# Patient Record
Sex: Female | Born: 1975 | Race: White | Hispanic: No | Marital: Married | Smoking: Former smoker
Health system: Southern US, Community
[De-identification: ages and names within clinical notes are randomized; demographics above are authoritative.]

## PROBLEM LIST (undated history)

## (undated) DIAGNOSIS — T7840XA Allergy, unspecified, initial encounter: Secondary | ICD-10-CM

## (undated) DIAGNOSIS — N809 Endometriosis, unspecified: Secondary | ICD-10-CM

## (undated) DIAGNOSIS — G4700 Insomnia, unspecified: Secondary | ICD-10-CM

## (undated) DIAGNOSIS — G43909 Migraine, unspecified, not intractable, without status migrainosus: Secondary | ICD-10-CM

## (undated) HISTORY — DX: Migraine, unspecified, not intractable, without status migrainosus: G43.909

## (undated) HISTORY — DX: Endometriosis, unspecified: N80.9

## (undated) HISTORY — DX: Insomnia, unspecified: G47.00

## (undated) HISTORY — DX: Allergy, unspecified, initial encounter: T78.40XA

---

## 2004-08-19 ENCOUNTER — Other Ambulatory Visit: Admission: RE | Admit: 2004-08-19 | Discharge: 2004-08-19 | Payer: Self-pay | Admitting: Obstetrics and Gynecology

## 2005-06-15 ENCOUNTER — Emergency Department (HOSPITAL_COMMUNITY): Admission: EM | Admit: 2005-06-15 | Discharge: 2005-06-15 | Payer: Self-pay | Admitting: Emergency Medicine

## 2005-07-03 ENCOUNTER — Ambulatory Visit: Payer: Self-pay | Admitting: Internal Medicine

## 2005-08-24 ENCOUNTER — Other Ambulatory Visit: Admission: RE | Admit: 2005-08-24 | Discharge: 2005-08-24 | Payer: Self-pay | Admitting: Obstetrics and Gynecology

## 2006-09-06 ENCOUNTER — Other Ambulatory Visit: Admission: RE | Admit: 2006-09-06 | Discharge: 2006-09-06 | Payer: Self-pay | Admitting: Obstetrics and Gynecology

## 2007-01-03 ENCOUNTER — Ambulatory Visit (HOSPITAL_BASED_OUTPATIENT_CLINIC_OR_DEPARTMENT_OTHER): Admission: RE | Admit: 2007-01-03 | Discharge: 2007-01-03 | Payer: Self-pay | Admitting: Obstetrics and Gynecology

## 2007-11-02 ENCOUNTER — Other Ambulatory Visit: Admission: RE | Admit: 2007-11-02 | Discharge: 2007-11-02 | Payer: Self-pay | Admitting: Obstetrics and Gynecology

## 2008-07-02 ENCOUNTER — Inpatient Hospital Stay (HOSPITAL_COMMUNITY): Admission: AD | Admit: 2008-07-02 | Discharge: 2008-07-02 | Payer: Self-pay | Admitting: Obstetrics and Gynecology

## 2008-07-03 ENCOUNTER — Observation Stay (HOSPITAL_COMMUNITY): Admission: AD | Admit: 2008-07-03 | Discharge: 2008-07-03 | Payer: Self-pay | Admitting: Obstetrics and Gynecology

## 2008-07-19 ENCOUNTER — Ambulatory Visit (HOSPITAL_COMMUNITY): Admission: RE | Admit: 2008-07-19 | Discharge: 2008-07-19 | Payer: Self-pay | Admitting: Obstetrics and Gynecology

## 2008-07-24 ENCOUNTER — Encounter: Admission: RE | Admit: 2008-07-24 | Discharge: 2008-07-24 | Payer: Self-pay | Admitting: Obstetrics and Gynecology

## 2008-08-06 ENCOUNTER — Inpatient Hospital Stay (HOSPITAL_COMMUNITY): Admission: AD | Admit: 2008-08-06 | Discharge: 2008-08-06 | Payer: Self-pay | Admitting: Obstetrics and Gynecology

## 2008-08-08 ENCOUNTER — Ambulatory Visit (HOSPITAL_COMMUNITY): Admission: RE | Admit: 2008-08-08 | Discharge: 2008-08-08 | Payer: Self-pay | Admitting: Obstetrics and Gynecology

## 2008-08-10 ENCOUNTER — Ambulatory Visit: Payer: Self-pay | Admitting: Gynecology

## 2008-08-17 ENCOUNTER — Ambulatory Visit: Payer: Self-pay | Admitting: Obstetrics & Gynecology

## 2008-08-22 ENCOUNTER — Ambulatory Visit: Payer: Self-pay | Admitting: Obstetrics & Gynecology

## 2008-08-29 ENCOUNTER — Ambulatory Visit: Payer: Self-pay | Admitting: Obstetrics & Gynecology

## 2008-08-31 ENCOUNTER — Inpatient Hospital Stay (HOSPITAL_COMMUNITY): Admission: RE | Admit: 2008-08-31 | Discharge: 2008-09-04 | Payer: Self-pay | Admitting: Obstetrics and Gynecology

## 2008-08-31 ENCOUNTER — Encounter (INDEPENDENT_AMBULATORY_CARE_PROVIDER_SITE_OTHER): Payer: Self-pay | Admitting: Obstetrics and Gynecology

## 2008-09-06 ENCOUNTER — Ambulatory Visit: Admission: RE | Admit: 2008-09-06 | Discharge: 2008-09-06 | Payer: Self-pay | Admitting: Obstetrics and Gynecology

## 2011-03-31 NOTE — Op Note (Signed)
NAMESAVANHA, ISLAND             ACCOUNT NO.:  [id number]   MEDICAL RECORD NO.:  [id number]          PATIENT TYPE:  INP   LOCATION:  9145                          FACILITY:  [facility]   PHYSICIAN:  Michelle L. Grewal, M.D.DATE OF BIRTH:  September 28, 1976   DATE OF PROCEDURE:  08/31/2008  DATE OF DISCHARGE:                               OPERATIVE REPORT   PREOPERATIVE DIAGNOSES:  Intrauterine pregnancy at 59 and 3/7, twins.  Twin A is footling breech; discordant growth of twin A, enlarged kidneys  of twin B; and cervical dilation.   POSTOPERATIVE DIAGNOSES:  Intrauterine pregnancy at 37 and 3/7, twins.  Twin A is footling breech, discordant growth of twin A; enlarged kidneys  of twin B; and cervical dilation.   PROCEDURE:  Primary low transverse cesarean section.   SURGEON:  Michelle L. Vincente Poli, MD   ANESTHESIA:  Spinal.   FINDINGS:  Baby A; female, footling breech, Apgars 7 at 1 minute and 8  at 5 minutes.  Baby B; also footling breech, 8 at 1 minute and 9 at 5  minutes.   SPECIMENS:  Placenta sent to Pathology.   ESTIMATED BLOOD LOSS:  500 mL.   COMPLICATIONS:  None.   DRAINS:  Foley.   PROCEDURE IN DETAIL:  The patient was taken to the operating room.  Her  spinal was placed and found to be adequate.  She was prepped and draped  in usual sterile fashion.  A Foley catheter was inserted.  A low  transverse incision was made, carried down to the fascia, fascia was  scored in the midline and extended bilaterally.  The rectus muscles were  separated in the midline.  The peritoneum was entered bluntly.  The  bladder blade was inserted, the lower uterine segment was identified,  and the bladder flap was created sharply and then digitally.  The  bladder blade was readjusted.  A low transverse incision was made in the  uterus.  Uterus was entered using a hemostat.  The baby A was in double  footling breech and there was significant cord around the feet.  The  baby was delivered via  breech extraction without difficulty.  There was  also a nuchal cord.  The baby was a female infant, Apgar 7 at 1 minute  and 8 at 5 minutes.  The cord was clamped and cut, and baby was handed  to the awaiting pediatrician.  The sac of B was then ruptured and the  fluid was clear, and the baby's feet came down near the uterine incision  and the baby was delivered via complete breech extraction because it was  presenting as a double footling breech.  The baby was a female infant,  Apgars 8 at 1 minute and 9 at 5 minutes.  The cord was clamped and cut,  and the baby was handed to the awaiting neonatal team.  They were both  taken to Newborn Nursery.  The cord blood was obtained.  The uterus was  explored and the placentas were removed and sent to pathology.  The  uterus was exteriorized and cleared of all clots and  debris.  Pitocin  and antibiotics were given.  The uterine incision was closed in 1 layer  using a 0 chromic in a running locked stitch.  The uterus was returned  to the abdomen.  Irrigation was performed.  The peritoneum and rectus  muscles were reapproximated using 0 Vicryl.  The fascia was closed using  0 Vicryl in a running stitch.  After  irrigation of subcutaneous layer, the skin was closed with a 3-0 Vicryl  subcutaneous and subcuticular, and the skin was closed with Dermabond  skin adhesive.  All sponge, lap, and instrument counts were correct x2.  The patient went to recovery room in stable condition.      Michelle L. Vincente Poli, M.D.  Electronically Signed     MLG/MEDQ  D:  08/31/2008  T:  09/01/2008  Job:  [id number]

## 2011-03-31 NOTE — Discharge Summary (Signed)
Brandi [name], Brandi [name]             ACCOUNT NO.:  [id number]   MEDICAL RECORD NO.:  [id number]         PATIENT TYPE:  WINP   LOCATION:  WOC                           FACILITY:  [facility]   PHYSICIAN:  Guy Sandifer. Henderson Cloud, M.D. DATE OF BIRTH:  20-Mar-1976   DATE OF ADMISSION:  08/29/2008  DATE OF DISCHARGE:  09/04/2008                               DISCHARGE SUMMARY   ADMITTING DIAGNOSES:  1. Intrauterine pregnancy at 30 and 3/7th weeks' estimated gestational      age.  2. Twin pregnancy with discordant growth.  3. Twin A footling breech.   DISCHARGE DIAGNOSIS:  1. Status post low-transverse cesarean section.  2. Viable female infants.   PROCEDURE:  Primary low-transverse cesarean section.   REASON FOR ADMISSION:  Please see written H&P.   HOSPITAL COURSE:  The patient was a 35 year old gravida 1, para 0 that  was admitted to [facility] at 35 and 3/7th weeks'  estimated gestational age for cesarean section.  The patient had been  observed in the office where she had known twin pregnancy, which had  been with concordant growth until approximately October 14,2009, at  which time twin A was noted to be somewhat smaller and there was  discordancy between A and B about 23%.  Twin A was also noted to be in  footling breech position and cervix was dilated to 1-2 cm with B  palpable through the cervix.  The patient was now admitted for delivery.  On admission, vital signs were stable.  Fetal heart tones were reactive.  The patient was taken to the operating room where spinal anesthesia was  administered without difficulty.  A low-transverse incision was made  with delivery of viable twin infants.  The patient tolerated the  procedure well and was taken to the recovery room in stable condition.  On postoperative day #1, the patient was without complaint.  Baby B was  in the NICU on nasal cannula.  Vital signs were stable.  Urine output  was good.  Abdomen soft.  Fundus firm,  nontender.  Incision was clean,  dry, and intact.  On postoperative day #2, baby B continued in the NICU  on room air.  Vital signs were stable.  Abdomen soft.  Incision was  clean, dry, and intact.  Laboratory findings revealed hemoglobin of 8.7  and platelet count of 175,000.  On postoperative day #3, baby B  continued in the NICU.  Vital signs were stable.  She was afebrile.  Abdomen soft.  Fundus firm and nontender.  Incision was clean, dry, and  intact with subcuticular closure noted.  On postoperative day #4, the  patient was without complaint.  Baby B continued in the NICU stable.  Vital signs were stable.  She was afebrile.  Fundus firm and nontender.  Incision was clean, dry, and intact.  The patient was ambulating well.  Discharge instructions were reviewed and the patient was later  discharged home.   CONDITION ON DISCHARGE:  Stable.   DIET:  Regular as tolerated.   ACTIVITY:  No heavy lifting, no driving x2 weeks, no vaginal entry.  FOLLOWUP:  The patient to follow up in the office in 1-2 weeks for  incision check.  She is to call for temperature greater than 100  degrees, persistent nausea, vomiting, heavy vaginal bleeding, and/or  redness or drainage from the incisional site.   DISCHARGE MEDICATIONS:  Percocet 5/325 #30 one p.o. every 4-6 hours  p.r.n.  Motrin 600 mg every 6 hours.  Integra 1 p.o. daily.  Prenatal  vitamins 1 p.o. daily.      Julio Sicks, N.P.      Guy Sandifer. Henderson Cloud, M.D.  Electronically Signed    CC/MEDQ  D:  09/04/2008  T:  09/04/2008  Job:  [id number]

## 2011-04-03 NOTE — Op Note (Signed)
Brandi [name], Brandi [name]             ACCOUNT NO.:  [id number]   MEDICAL RECORD NO.:  [id number]          PATIENT TYPE:  AMB   LOCATION:  NESC                         FACILITY:  [facility]   PHYSICIAN:  Cynthia P. Romine, M.D.DATE OF BIRTH:  [birth year]   DATE OF PROCEDURE:  01/03/2007  DATE OF DISCHARGE:                               OPERATIVE REPORT   PREOPERATIVE DIAGNOSIS:  Infertility, rule out endometriosis.   POSTOPERATIVE DIAGNOSIS:  Infertility, endometriosis.   PROCEDURE:  Diagnostic laparoscopy, YAG  laser vaporization of  endometriotic lesion, tubal dye studies.   SURGEON:  Cynthia P. Romine, M.D.   ANESTHESIA:  General endotracheal.   ESTIMATED BLOOD LOSS:  Minimal.   COMPLICATIONS:  None.   PROCEDURE:  The patient was taken to the operating room and after  induction of adequate general endotracheal anesthesia, was placed in  dorsal lithotomy position and prepped and draped in the usual fashion.  A acorn uterine manipulator was placed.  Attention was next turned to  the abdomen.  A small subumbilical incision was made in the crease of  the umbilicus and the Veress needle was inserted into peritoneal space.  Proper placement was tested by noting a negative aspirate, then free  flow of saline to the Veress needle again with negative aspirate and  then by noting the response of a drop of saline placed at the hub of the  Veress needle to negative pressure as the abdominal wall was elevated.  Pneumoperitoneum was created with 2 liters of CO2 using the Stryker  automatic insufflator.  A disposable 10/11 mm trocar was then inserted  into the peritoneal space and its proper placement noted with a  laparoscope.  Inspection of the pelvis was done and revealed there to be  an area of endometriosis in the right uterosacral ligament and because  it was obvious that there was endometriosis present, it was felt that  these lower two incisions were needed.  Transillumination was done  to  avoid vessels and two 5 mm incisions were made suprapubically on the  right and the left and 5 mm trocars were inserted under direct  visualization.  The uterus was elevated.  The tubes and ovaries were  freely mobile on both sides.  The upper abdomen was examined.  The liver  and gallbladder appeared normal.  There was some scarring on the area of  the appendix and the appendix could not be brought out of the area  behind the cecum.  In the pelvis the right ovary had some punctate areas  of brown endometriosis.  Along the pelvic sidewall directly on top of  the ureter were some clear vesicular areas consistent with  endometriosis.  On the left uterosacral sacral ligament, there was some  endometriosis and on the left ovary there was approximately 1 cm  diameter of round and red endometriosis.  Chromopertubation was then  carried out using indigo carmine and there was free spill of indigo  carmine through the fimbria of both tubes.  The fimbria of both appeared  luxuriant and normal.  The YAG laser was then inserted and set at a  power setting of 15 watts and the areas of endometriosis over the right  and left uterosacral ligaments on both ovaries were ablated.  The clear  vesicular lesions over the ureter were not ablated.  The pelvis was  irrigated with the Nezhat aspirator irrigator. The instruments were  removed from the pelvis and pneumoperitoneum was allowed to escape.  The  sleeves were  removed.  The incisions were closed subcuticularly with 3-0 Vicryl  Rapide and then with Dermabond.  The instruments removed from vagina and  the procedure was terminated.  The patient tolerated it well and went in  satisfactory condition to post anesthesia recovery.      Cynthia P. Romine, M.D.  Electronically Signed     CPR/MEDQ  D:  01/03/2007  T:  01/03/2007  Job:  [id number]

## 2011-08-18 LAB — CBC
HCT: 25.2 — ABNORMAL LOW
HCT: 31.7 — ABNORMAL LOW
Hemoglobin: 8.7 — ABNORMAL LOW
MCHC: 34.5
MCV: 95.5
MCV: 95.9
RBC: 3.32 — ABNORMAL LOW
RDW: 15.4
WBC: 10.1

## 2011-11-17 HISTORY — PX: OTHER SURGICAL HISTORY: SHX169

## 2012-04-08 LAB — OB RESULTS CONSOLE RPR: RPR: NONREACTIVE

## 2012-04-08 LAB — OB RESULTS CONSOLE ABO/RH: RH Type: POSITIVE

## 2012-04-08 LAB — OB RESULTS CONSOLE HEPATITIS B SURFACE ANTIGEN: Hepatitis B Surface Ag: NEGATIVE

## 2012-05-03 ENCOUNTER — Other Ambulatory Visit: Payer: Self-pay | Admitting: Obstetrics and Gynecology

## 2012-11-10 ENCOUNTER — Encounter (HOSPITAL_COMMUNITY): Payer: Self-pay | Admitting: *Deleted

## 2012-11-10 ENCOUNTER — Inpatient Hospital Stay (HOSPITAL_COMMUNITY)
Admission: RE | Admit: 2012-11-10 | Discharge: 2012-11-13 | DRG: 765 | Disposition: A | Discharge status: Home / Self Care | Payer: 59 | Source: Ambulatory Visit | Attending: Obstetrics and Gynecology | Admitting: Obstetrics and Gynecology

## 2012-11-10 ENCOUNTER — Encounter (HOSPITAL_COMMUNITY): Payer: Self-pay | Admitting: Anesthesiology

## 2012-11-10 ENCOUNTER — Encounter (HOSPITAL_COMMUNITY)
Admission: RE | Disposition: A | Discharge status: Home / Self Care | Payer: Self-pay | Source: Ambulatory Visit | Attending: Obstetrics and Gynecology

## 2012-11-10 ENCOUNTER — Inpatient Hospital Stay (HOSPITAL_COMMUNITY): Payer: 59 | Admitting: Anesthesiology

## 2012-11-10 DIAGNOSIS — D62 Acute posthemorrhagic anemia: Secondary | ICD-10-CM | POA: Diagnosis not present

## 2012-11-10 DIAGNOSIS — O34219 Maternal care for unspecified type scar from previous cesarean delivery: Principal | ICD-10-CM | POA: Diagnosis present

## 2012-11-10 DIAGNOSIS — Z302 Encounter for sterilization: Secondary | ICD-10-CM

## 2012-11-10 DIAGNOSIS — O9903 Anemia complicating the puerperium: Secondary | ICD-10-CM | POA: Diagnosis not present

## 2012-11-10 DIAGNOSIS — O09529 Supervision of elderly multigravida, unspecified trimester: Secondary | ICD-10-CM | POA: Diagnosis present

## 2012-11-10 LAB — CBC
Hemoglobin: 12.1 g/dL (ref 12.0–15.0)
MCH: 31.9 pg (ref 26.0–34.0)
MCHC: 34.3 g/dL (ref 30.0–36.0)
Platelets: 177 10*3/uL (ref 150–400)
RBC: 3.79 MIL/uL — ABNORMAL LOW (ref 3.87–5.11)

## 2012-11-10 LAB — RPR: RPR Ser Ql: NONREACTIVE

## 2012-11-10 LAB — TYPE AND SCREEN
ABO/RH(D): O POS
Antibody Screen: NEGATIVE

## 2012-11-10 LAB — ABO/RH: ABO/RH(D): O POS

## 2012-11-10 SURGERY — Surgical Case
Anesthesia: Spinal | Site: Abdomen | Laterality: Bilateral | Wound class: Clean Contaminated

## 2012-11-10 MED ORDER — PROMETHAZINE HCL 25 MG/ML IJ SOLN: 6.2500 mg | INTRAMUSCULAR | Status: DC | PRN

## 2012-11-10 MED ORDER — DIPHENHYDRAMINE HCL 50 MG/ML IJ SOLN
12.5000 mg | INTRAMUSCULAR | Status: DC | PRN
Start: 2012-11-10 — End: 2012-11-13

## 2012-11-10 MED ORDER — MORPHINE SULFATE (PF) 0.5 MG/ML IJ SOLN
INTRAMUSCULAR | Status: DC | PRN
  Administered 2012-11-10: .1 mg via INTRATHECAL

## 2012-11-10 MED ORDER — NALOXONE HCL 0.4 MG/ML IJ SOLN: 0.4000 mg | INTRAMUSCULAR | Status: DC | PRN

## 2012-11-10 MED ORDER — KETOROLAC TROMETHAMINE 30 MG/ML IJ SOLN: 30.0000 mg | Freq: Four times a day (QID) | INTRAMUSCULAR | Status: AC | PRN

## 2012-11-10 MED ORDER — SENNOSIDES-DOCUSATE SODIUM 8.6-50 MG PO TABS
2.0000 | ORAL_TABLET | Freq: Every day | ORAL | Status: DC
  Administered 2012-11-10 – 2012-11-12 (×3): 2 via ORAL

## 2012-11-10 MED ORDER — OXYTOCIN 40 UNITS IN LACTATED RINGERS INFUSION - SIMPLE MED: 62.5000 mL/h | INTRAVENOUS | Status: AC

## 2012-11-10 MED ORDER — SCOPOLAMINE 1 MG/3DAYS TD PT72
MEDICATED_PATCH | TRANSDERMAL | Status: AC
  Administered 2012-11-10: 1.5 mg via TRANSDERMAL
  Filled 2012-11-10: qty 1

## 2012-11-10 MED ORDER — NALBUPHINE SYRINGE 5 MG/0.5 ML
INJECTION | INTRAMUSCULAR | Status: AC
  Administered 2012-11-10: 5 mg via SUBCUTANEOUS
  Filled 2012-11-10: qty 0.5

## 2012-11-10 MED ORDER — KETOROLAC TROMETHAMINE 60 MG/2ML IM SOLN
60.0000 mg | Freq: Once | INTRAMUSCULAR | Status: AC | PRN
  Administered 2012-11-10: 60 mg via INTRAMUSCULAR

## 2012-11-10 MED ORDER — PHENYLEPHRINE HCL 10 MG/ML IJ SOLN
INTRAMUSCULAR | Status: DC | PRN
  Administered 2012-11-10: 80 ug via INTRAVENOUS
  Administered 2012-11-10: 40 ug via INTRAVENOUS
  Administered 2012-11-10 (×3): 80 ug via INTRAVENOUS
  Administered 2012-11-10: 40 ug via INTRAVENOUS

## 2012-11-10 MED ORDER — SCOPOLAMINE 1 MG/3DAYS TD PT72
1.0000 | MEDICATED_PATCH | Freq: Once | TRANSDERMAL | Status: DC
  Administered 2012-11-10: 1.5 mg via TRANSDERMAL

## 2012-11-10 MED ORDER — HYDROMORPHONE HCL PF 1 MG/ML IJ SOLN
INTRAMUSCULAR | Status: AC
  Filled 2012-11-10: qty 1

## 2012-11-10 MED ORDER — OXYTOCIN 10 UNIT/ML IJ SOLN
40.0000 [IU] | INTRAVENOUS | Status: DC | PRN
  Administered 2012-11-10: 40 [IU] via INTRAVENOUS

## 2012-11-10 MED ORDER — NALBUPHINE HCL 10 MG/ML IJ SOLN
5.0000 mg | INTRAMUSCULAR | Status: DC | PRN
  Administered 2012-11-10: 10 mg via INTRAVENOUS
  Filled 2012-11-10: qty 1

## 2012-11-10 MED ORDER — SIMETHICONE 80 MG PO CHEW
80.0000 mg | CHEWABLE_TABLET | Freq: Three times a day (TID) | ORAL | Status: DC
  Administered 2012-11-10 – 2012-11-12 (×9): 80 mg via ORAL

## 2012-11-10 MED ORDER — DIPHENHYDRAMINE HCL 25 MG PO CAPS
25.0000 mg | ORAL_CAPSULE | ORAL | Status: DC | PRN
  Administered 2012-11-11 – 2012-11-12 (×5): 25 mg via ORAL
  Filled 2012-11-10 (×4): qty 1

## 2012-11-10 MED ORDER — FENTANYL CITRATE 0.05 MG/ML IJ SOLN
100.0000 ug | Freq: Once | INTRAMUSCULAR | Status: AC
  Administered 2012-11-10: 100 ug via INTRAVENOUS

## 2012-11-10 MED ORDER — IBUPROFEN 600 MG PO TABS
600.0000 mg | ORAL_TABLET | Freq: Four times a day (QID) | ORAL | Status: DC
  Administered 2012-11-11 – 2012-11-13 (×9): 600 mg via ORAL
  Filled 2012-11-10 (×9): qty 1

## 2012-11-10 MED ORDER — CEFAZOLIN SODIUM-DEXTROSE 2-3 GM-% IV SOLR
INTRAVENOUS | Status: AC
  Filled 2012-11-10: qty 50

## 2012-11-10 MED ORDER — LANOLIN HYDROUS EX OINT: 1.0000 "application " | TOPICAL_OINTMENT | CUTANEOUS | Status: DC | PRN

## 2012-11-10 MED ORDER — FENTANYL CITRATE 0.05 MG/ML IJ SOLN
INTRAMUSCULAR | Status: DC | PRN
  Administered 2012-11-10: 12.5 ug via INTRAVENOUS

## 2012-11-10 MED ORDER — DIPHENHYDRAMINE HCL 25 MG PO CAPS
25.0000 mg | ORAL_CAPSULE | Freq: Four times a day (QID) | ORAL | Status: DC | PRN
  Filled 2012-11-10: qty 1

## 2012-11-10 MED ORDER — BUPIVACAINE IN DEXTROSE 0.75-8.25 % IT SOLN
INTRATHECAL | Status: DC | PRN
  Administered 2012-11-10: 1.4 mL via INTRATHECAL

## 2012-11-10 MED ORDER — HYDROMORPHONE HCL PF 1 MG/ML IJ SOLN
0.2500 mg | INTRAMUSCULAR | Status: DC | PRN
  Administered 2012-11-10 (×3): 0.5 mg via INTRAVENOUS

## 2012-11-10 MED ORDER — ONDANSETRON HCL 4 MG PO TABS: 4.0000 mg | ORAL_TABLET | ORAL | Status: DC | PRN

## 2012-11-10 MED ORDER — MORPHINE SULFATE 0.5 MG/ML IJ SOLN
INTRAMUSCULAR | Status: AC
  Filled 2012-11-10: qty 10

## 2012-11-10 MED ORDER — LACTATED RINGERS IV SOLN: INTRAVENOUS | Status: DC

## 2012-11-10 MED ORDER — LACTATED RINGERS IV SOLN
INTRAVENOUS | Status: DC
  Administered 2012-11-10 – 2012-11-11 (×2): via INTRAVENOUS

## 2012-11-10 MED ORDER — 0.9 % SODIUM CHLORIDE (POUR BTL) OPTIME
TOPICAL | Status: DC | PRN
  Administered 2012-11-10: 1000 mL

## 2012-11-10 MED ORDER — MUPIROCIN 2 % EX OINT: TOPICAL_OINTMENT | Freq: Two times a day (BID) | CUTANEOUS | Status: DC

## 2012-11-10 MED ORDER — SODIUM CHLORIDE 0.9 % IJ SOLN: 3.0000 mL | INTRAMUSCULAR | Status: DC | PRN

## 2012-11-10 MED ORDER — MEPERIDINE HCL 25 MG/ML IJ SOLN: 6.2500 mg | INTRAMUSCULAR | Status: DC | PRN

## 2012-11-10 MED ORDER — TETANUS-DIPHTH-ACELL PERTUSSIS 5-2.5-18.5 LF-MCG/0.5 IM SUSP: 0.5000 mL | Freq: Once | INTRAMUSCULAR | Status: DC

## 2012-11-10 MED ORDER — ONDANSETRON HCL 4 MG/2ML IJ SOLN: 4.0000 mg | INTRAMUSCULAR | Status: DC | PRN

## 2012-11-10 MED ORDER — NALBUPHINE HCL 10 MG/ML IJ SOLN
5.0000 mg | INTRAMUSCULAR | Status: DC | PRN
  Administered 2012-11-10: 5 mg via SUBCUTANEOUS

## 2012-11-10 MED ORDER — KETOROLAC TROMETHAMINE 30 MG/ML IJ SOLN: 15.0000 mg | Freq: Once | INTRAMUSCULAR | Status: DC | PRN

## 2012-11-10 MED ORDER — ONDANSETRON HCL 4 MG/2ML IJ SOLN: 4.0000 mg | Freq: Three times a day (TID) | INTRAMUSCULAR | Status: DC | PRN

## 2012-11-10 MED ORDER — FENTANYL CITRATE 0.05 MG/ML IJ SOLN
INTRAMUSCULAR | Status: AC
  Administered 2012-11-10: 100 ug via INTRAVENOUS
  Filled 2012-11-10: qty 2

## 2012-11-10 MED ORDER — FENTANYL CITRATE 0.05 MG/ML IJ SOLN
INTRAMUSCULAR | Status: AC
  Filled 2012-11-10: qty 2

## 2012-11-10 MED ORDER — CEFAZOLIN SODIUM-DEXTROSE 2-3 GM-% IV SOLR
2.0000 g | INTRAVENOUS | Status: AC
  Administered 2012-11-10: 2 g via INTRAVENOUS

## 2012-11-10 MED ORDER — HYDROMORPHONE HCL PF 1 MG/ML IJ SOLN
INTRAMUSCULAR | Status: AC
  Administered 2012-11-10: 0.5 mg via INTRAVENOUS
  Filled 2012-11-10: qty 1

## 2012-11-10 MED ORDER — PRENATAL MULTIVITAMIN CH
1.0000 | ORAL_TABLET | Freq: Every day | ORAL | Status: DC
  Administered 2012-11-11 – 2012-11-13 (×3): 1 via ORAL
  Filled 2012-11-10 (×2): qty 1

## 2012-11-10 MED ORDER — DIBUCAINE 1 % RE OINT
1.0000 "application " | TOPICAL_OINTMENT | RECTAL | Status: DC | PRN
  Administered 2012-11-10: 1 via RECTAL
  Filled 2012-11-10: qty 28

## 2012-11-10 MED ORDER — SCOPOLAMINE 1 MG/3DAYS TD PT72: 1.0000 | MEDICATED_PATCH | Freq: Once | TRANSDERMAL | Status: DC

## 2012-11-10 MED ORDER — EPHEDRINE 5 MG/ML INJ
INTRAVENOUS | Status: AC
  Filled 2012-11-10: qty 10

## 2012-11-10 MED ORDER — KETOROLAC TROMETHAMINE 60 MG/2ML IM SOLN
INTRAMUSCULAR | Status: AC
  Administered 2012-11-10: 60 mg via INTRAMUSCULAR
  Filled 2012-11-10: qty 2

## 2012-11-10 MED ORDER — METOCLOPRAMIDE HCL 5 MG/ML IJ SOLN: 10.0000 mg | Freq: Three times a day (TID) | INTRAMUSCULAR | Status: DC | PRN

## 2012-11-10 MED ORDER — OXYCODONE-ACETAMINOPHEN 5-325 MG PO TABS
1.0000 | ORAL_TABLET | ORAL | Status: DC | PRN
  Administered 2012-11-11: 1 via ORAL
  Administered 2012-11-11 (×3): 2 via ORAL
  Administered 2012-11-11 – 2012-11-12 (×5): 1 via ORAL
  Administered 2012-11-12: 2 via ORAL
  Administered 2012-11-13: 1 via ORAL
  Administered 2012-11-13: 2 via ORAL
  Administered 2012-11-13: 1 via ORAL
  Filled 2012-11-10: qty 1
  Filled 2012-11-10 (×2): qty 2
  Filled 2012-11-10 (×3): qty 1
  Filled 2012-11-10: qty 2
  Filled 2012-11-10 (×3): qty 1
  Filled 2012-11-10: qty 2
  Filled 2012-11-10: qty 1
  Filled 2012-11-10: qty 2

## 2012-11-10 MED ORDER — MENTHOL 3 MG MT LOZG: 1.0000 | LOZENGE | OROMUCOSAL | Status: DC | PRN

## 2012-11-10 MED ORDER — ZOLPIDEM TARTRATE 5 MG PO TABS: 5.0000 mg | ORAL_TABLET | Freq: Every evening | ORAL | Status: DC | PRN

## 2012-11-10 MED ORDER — ONDANSETRON HCL 4 MG/2ML IJ SOLN
INTRAMUSCULAR | Status: DC | PRN
  Administered 2012-11-10: 4 mg via INTRAVENOUS

## 2012-11-10 MED ORDER — KETOROLAC TROMETHAMINE 30 MG/ML IJ SOLN
30.0000 mg | Freq: Four times a day (QID) | INTRAMUSCULAR | Status: AC | PRN
  Filled 2012-11-10: qty 1

## 2012-11-10 MED ORDER — SIMETHICONE 80 MG PO CHEW: 80.0000 mg | CHEWABLE_TABLET | ORAL | Status: DC | PRN

## 2012-11-10 MED ORDER — NALOXONE HCL 1 MG/ML IJ SOLN: 1.0000 ug/kg/h | INTRAVENOUS | Status: DC | PRN

## 2012-11-10 MED ORDER — DIPHENHYDRAMINE HCL 50 MG/ML IJ SOLN: 25.0000 mg | INTRAMUSCULAR | Status: DC | PRN

## 2012-11-10 MED ORDER — WITCH HAZEL-GLYCERIN EX PADS
1.0000 "application " | MEDICATED_PAD | CUTANEOUS | Status: DC | PRN
  Administered 2012-11-10: 1 via TOPICAL

## 2012-11-10 MED ORDER — LACTATED RINGERS IV SOLN
INTRAVENOUS | Status: DC
  Administered 2012-11-10 (×3): via INTRAVENOUS

## 2012-11-10 MED ORDER — EPHEDRINE SULFATE 50 MG/ML IJ SOLN
INTRAMUSCULAR | Status: DC | PRN
  Administered 2012-11-10 (×4): 10 mg via INTRAVENOUS

## 2012-11-10 MED ORDER — SCOPOLAMINE 1 MG/3DAYS TD PT72
MEDICATED_PATCH | TRANSDERMAL | Status: AC
  Filled 2012-11-10: qty 1

## 2012-11-10 SURGICAL SUPPLY — 32 items
BARRIER ADHS 3X4 INTERCEED (GAUZE/BANDAGES/DRESSINGS) IMPLANT
CLIP FILSHIE TUBAL LIGA STRL (Clip) ×2 IMPLANT
CLOTH BEACON ORANGE TIMEOUT ST (SAFETY) ×2 IMPLANT
CONTAINER PREFILL 10% NBF 15ML (MISCELLANEOUS) IMPLANT
DERMABOND ADVANCED (GAUZE/BANDAGES/DRESSINGS) ×1
DERMABOND ADVANCED .7 DNX12 (GAUZE/BANDAGES/DRESSINGS) ×1 IMPLANT
DRAPE LG THREE QUARTER DISP (DRAPES) ×2 IMPLANT
DRSG OPSITE POSTOP 4X10 (GAUZE/BANDAGES/DRESSINGS) IMPLANT
DURAPREP 26ML APPLICATOR (WOUND CARE) ×2 IMPLANT
ELECT REM PT RETURN 9FT ADLT (ELECTROSURGICAL) ×2
ELECTRODE REM PT RTRN 9FT ADLT (ELECTROSURGICAL) ×1 IMPLANT
EXTRACTOR VACUUM M CUP 4 TUBE (SUCTIONS) IMPLANT
GLOVE BIO SURGEON STRL SZ 6.5 (GLOVE) ×4 IMPLANT
GOWN PREVENTION PLUS LG XLONG (DISPOSABLE) ×6 IMPLANT
KIT ABG SYR 3ML LUER SLIP (SYRINGE) IMPLANT
NEEDLE HYPO 22GX1.5 SAFETY (NEEDLE) IMPLANT
NEEDLE HYPO 25X5/8 SAFETYGLIDE (NEEDLE) ×2 IMPLANT
NS IRRIG 1000ML POUR BTL (IV SOLUTION) ×2 IMPLANT
PACK C SECTION WH (CUSTOM PROCEDURE TRAY) ×2 IMPLANT
PAD OB MATERNITY 4.3X12.25 (PERSONAL CARE ITEMS) IMPLANT
SLEEVE SCD COMPRESS KNEE MED (MISCELLANEOUS) IMPLANT
STAPLER VISISTAT 35W (STAPLE) IMPLANT
SUT CHROMIC 0 CTX 36 (SUTURE) ×4 IMPLANT
SUT PLAIN 0 NONE (SUTURE) IMPLANT
SUT PLAIN 2 0 XLH (SUTURE) IMPLANT
SUT VIC AB 0 CT1 27 (SUTURE) ×3
SUT VIC AB 0 CT1 27XBRD ANBCTR (SUTURE) ×3 IMPLANT
SUT VIC AB 4-0 KS 27 (SUTURE) IMPLANT
SYR CONTROL 10ML LL (SYRINGE) IMPLANT
TOWEL OR 17X24 6PK STRL BLUE (TOWEL DISPOSABLE) ×6 IMPLANT
TRAY FOLEY CATH 14FR (SET/KITS/TRAYS/PACK) ×2 IMPLANT
WATER STERILE IRR 1000ML POUR (IV SOLUTION) ×2 IMPLANT

## 2012-11-10 NOTE — Brief Op Note (Signed)
11/10/2012  3:48 PM  PATIENT:  Brandi [name]  36 y.o. female  PRE-OPERATIVE DIAGNOSIS:   IUP at 61 w 3 days Previous Cesarean Section Labor Desires permanent sterilization  POST-OPERATIVE DIAGNOSIS:  Same  PROCEDURE:  Procedure(s) (LRB) with comments: CESAREAN SECTION WITH BILATERAL TUBAL LIGATION (Bilateral)  SURGEON:  Surgeon(s) and Role:    * Jeani Hawking, MD - Primary  PHYSICIAN ASSISTANT:   ASSISTANTS: none   ANESTHESIA:   spinal  EBL:  Total I/O In: 2000 [I.V.:2000] Out: 800 [Urine:200; Blood:600]  BLOOD ADMINISTERED:none  DRAINS: Urinary Catheter (Foley)   LOCAL MEDICATIONS USED:  NONE  SPECIMEN:  No Specimen  DISPOSITION OF SPECIMEN:  N/A  COUNTS:  YES  TOURNIQUET:  * No tourniquets in log *  DICTATION: .Other Dictation: Dictation Number [id number]  PLAN OF CARE: Admit to inpatient   PATIENT DISPOSITION:  PACU - hemodynamically stable.   Delay start of Pharmacological VTE agent (>24hrs) due to surgical blood loss or risk of bleeding: not applicable

## 2012-11-10 NOTE — Progress Notes (Signed)
Dr Arby Barrette notified of pt's pain level 8 c/o burning at incision site.  Order received to give Fentanyl 2mL IV now and transfer to floor.

## 2012-11-10 NOTE — H&P (Signed)
36 year old G 2 P 0102 (twins) at 78 w 3 days presents from the office. History of C Section for twins. She started having UCs this am. No ROM. Cervix was closed last week. Cervix this am was 2 cm/80%. 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

## 2012-11-10 NOTE — Anesthesia Procedure Notes (Signed)
Spinal  Patient location during procedure: OR Start time: 11/10/2012 3:01 PM End time: 11/10/2012 3:04 PM Staffing Anesthesiologist: Sandrea Hughs Performed by: anesthesiologist  Preanesthetic Checklist Completed: patient identified, site marked, surgical consent, pre-op evaluation, timeout performed, IV checked, risks and benefits discussed and monitors and equipment checked Spinal Block Patient position: sitting Prep: DuraPrep Patient monitoring: heart rate, cardiac monitor, continuous pulse ox and blood pressure Approach: midline Location: L3-4 Injection technique: single-shot Needle Needle type: Sprotte  Needle gauge: 24 G Needle length: 9 cm Needle insertion depth: 5 cm Assessment Sensory level: T4

## 2012-11-10 NOTE — Transfer of Care (Signed)
Immediate Anesthesia Transfer of Care Note  Patient: Brandi [name]  Procedure(s) Performed: Procedure(s) (LRB) with comments: CESAREAN SECTION WITH BILATERAL TUBAL LIGATION (Bilateral)  Patient Location: PACU  Anesthesia Type:Spinal  Level of Consciousness: awake, alert  and oriented  Airway & Oxygen Therapy: Patient Spontanous Breathing  Post-op Assessment: Report given to PACU RN and Post -op Vital signs reviewed and stable  Post vital signs: Reviewed and stable  Complications: No apparent anesthesia complications

## 2012-11-10 NOTE — Anesthesia Postprocedure Evaluation (Signed)
Anesthesia Post Note  Patient: Brandi [name]  Procedure(s) Performed: Procedure(s) (LRB): CESAREAN SECTION WITH BILATERAL TUBAL LIGATION (Bilateral)  Anesthesia type: Spinal  Patient location: PACU  Post pain: Pain level controlled  Post assessment: Post-op Vital signs reviewed  Last Vitals:  Filed Vitals:   11/10/12 1330  BP: 124/64  Pulse: 85  Temp: 36.8 C    Post vital signs: Reviewed  Level of consciousness: awake  Complications: No apparent anesthesia complications

## 2012-11-10 NOTE — Anesthesia Preprocedure Evaluation (Signed)
Anesthesia Evaluation  Patient identified by MRN, date of birth, ID band Patient awake    Reviewed: Allergy & Precautions, H&P , NPO status , Patient's Chart, lab work & pertinent test results  Airway Mallampati: I TM Distance: >3 FB Neck ROM: full    Dental No notable dental hx.    Pulmonary neg pulmonary ROS,    Pulmonary exam normal       Cardiovascular negative cardio ROS      Neuro/Psych negative neurological ROS  negative psych ROS   GI/Hepatic negative GI ROS, Neg liver ROS,   Endo/Other  negative endocrine ROS  Renal/GU negative Renal ROS  negative genitourinary   Musculoskeletal negative musculoskeletal ROS (+)   Abdominal Normal abdominal exam  (+)   Peds  Hematology negative hematology ROS (+)   Anesthesia Other Findings   Reproductive/Obstetrics (+) Pregnancy                           Anesthesia Physical Anesthesia Plan  ASA: II  Anesthesia Plan: Spinal   Post-op Pain Management:    Induction:   Airway Management Planned:   Additional Equipment:   Intra-op Plan:   Post-operative Plan:   Informed Consent: I have reviewed the patients History and Physical, chart, labs and discussed the procedure including the risks, benefits and alternatives for the proposed anesthesia with the patient or authorized representative who has indicated his/her understanding and acceptance.     Plan Discussed with: CRNA and Surgeon  Anesthesia Plan Comments:         Anesthesia Quick Evaluation

## 2012-11-11 ENCOUNTER — Encounter (HOSPITAL_COMMUNITY): Payer: Self-pay | Admitting: Obstetrics and Gynecology

## 2012-11-11 LAB — CBC
HCT: 26.8 % — ABNORMAL LOW (ref 36.0–46.0)
Hemoglobin: 9.3 g/dL — ABNORMAL LOW (ref 12.0–15.0)
MCV: 93.4 fL (ref 78.0–100.0)
RBC: 2.87 MIL/uL — ABNORMAL LOW (ref 3.87–5.11)
WBC: 9.2 10*3/uL (ref 4.0–10.5)

## 2012-11-11 MED ORDER — FERROUS SULFATE 325 (65 FE) MG PO TABS
325.0000 mg | ORAL_TABLET | Freq: Two times a day (BID) | ORAL | Status: DC
  Administered 2012-11-12 – 2012-11-13 (×3): 325 mg via ORAL
  Filled 2012-11-11 (×3): qty 1

## 2012-11-11 MED ORDER — LORATADINE 10 MG PO TABS
10.0000 mg | ORAL_TABLET | Freq: Every day | ORAL | Status: DC
  Administered 2012-11-11 – 2012-11-12 (×3): 10 mg via ORAL
  Filled 2012-11-11 (×3): qty 1

## 2012-11-11 MED ORDER — FLUTICASONE PROPIONATE 50 MCG/ACT NA SUSP
2.0000 | Freq: Every day | NASAL | Status: AC
  Administered 2012-11-11: 2 via NASAL
  Filled 2012-11-11: qty 16

## 2012-11-11 NOTE — Op Note (Signed)
[name], Brandi             ACCOUNT NO.:  [id number]  MEDICAL RECORD NO.:  [id number]  LOCATION:  [facility]                          FACILITY:  [facility]  PHYSICIAN:  Deyjah Kindel L. Vyolet Sakuma, M.D.DATE OF BIRTH:  1976/02/09  DATE OF PROCEDURE:  11/10/2012 DATE OF DISCHARGE:                              OPERATIVE REPORT   PREOPERATIVE DIAGNOSES: 1. Intrauterine pregnancy at 36 and 3. 2. Previous cesarean section. 3. Labor. 4. Desires permanent sterilization.  POSTOPERATIVE DIAGNOSES: 1. Intrauterine pregnancy at 61 and 3. 2. Previous cesarean section. 3. Labor. 4. Desires permanent sterilization.  PROCEDURE:  Repeat low transverse cesarean section and bilateral tubal ligation.  SURGEON:  Airica Schwartzkopf L. Vincente Poli, M.D.  ANESTHESIA:  Spinal.  ESTIMATED BLOOD LOSS:  Less than 500 mL.  COMPLICATIONS:  None.  DRAINS:  Foley.  PATHOLOGY:  None.  PROCEDURE:  The patient was taken to the operating room.  Her spinal was placed by Dr. Arby Barrette.  She was then prepped and draped in usual sterile fashion.  A Foley catheter was inserted.  She was prepped.  Time- out was performed.  A low transverse incision was made at the area of the previous C-section scar and carried down to the fascia.  Fascia scored in the midline, extended laterally.  Rectus muscles were separated in the midline.  The peritoneum was entered bluntly.  The peritoneal incision was then stretched.  The bladder blade was inserted. The lower uterine segment was identified and the bladder flap was created sharply and then digitally.  The bladder blade was then readjusted.  A low transverse incision was made in the uterus.  Uterus was entered using a hemostat.  The baby was in cephalic presentation and delivered easily with vacuum extractor.  The baby was a female infant, Apgars 9 at 1 minute and 10 at 5 minutes.  The cord was clamped and cut. The baby was handed to the awaiting neonatal team.  The placenta was manually removed  noted to be normal intact with a three-vessel cord. The uterus was exteriorized and cleared of all clots and debris.  The uterine incision was closed in 1 layer using 0 chromic in a running, locked stitch.  The Filshie clips were placed across each midportion of each fallopian tube for the tubal ligation.  Ovaries appeared normal. The uterus was returned to the abdomen.  Irrigation performed. Hemostasis was excellent.  The peritoneum was closed using 0 Vicryl. The rectus muscles were reapproximated using 0 Vicryl.  The peritoneum after it was closed, the fascia was then closed using 0 Vicryl in a running stitch.  After irrigation of subcutaneous layer, the skin was closed with 4-0 Vicryl on a Keith needle.  Dermabond was applied, and then a sterile bandage was applied.  All sponge, lap, and instrument counts were correct x2.  The patient went to recovery room in stable condition.     Samule Life L. Vincente Poli, M.D.     Florestine Avers  D:  11/10/2012  T:  11/11/2012  Job:  [id number]

## 2012-11-11 NOTE — Progress Notes (Signed)
Subjective: Postpartum Day 1: Cesarean Delivery Patient reports tolerating PO.  Ambulating well.  Normal lochia.  Pain well controlled with meds.  Would like newborn circumcised.    Objective: Vital signs in last 24 hours: Temp:  [97.4 F (36.3 C)-98.4 F (36.9 C)] 98.3 F (36.8 C) (12/27 0630) Pulse Rate:  [61-85] 69  (12/27 0630) Resp:  [16-26] 18  (12/27 0630) BP: (92-148)/(45-78) 103/61 mmHg (12/27 0630) SpO2:  [95 %-100 %] 96 % (12/27 0630) Weight:  [160 lb (72.576 kg)] 160 lb (72.576 kg) (12/26 1330)  Physical Exam:  General: alert, cooperative and appears stated age 36: appropriate Uterine Fundus: firm Incision: healing well, no significant drainage, no dehiscence, no significant erythema DVT Evaluation: No evidence of DVT seen on physical exam. Negative Homan's sign. No cords or calf tenderness.   Basename 11/11/12 0530 11/10/12 1330  HGB 9.3* 12.1  HCT 26.8* 35.3*    Assessment/Plan: Status post Cesarean section. Doing well postoperatively.  Continue current care. ABL anemia: FeSO4 started.  Marissa Lowrey 11/11/2012, 8:11 AM

## 2012-11-12 NOTE — Progress Notes (Signed)
Subjective: Postpartum Day 2: Cesarean Delivery Patient reports tolerating PO and no problems voiding.    Objective: Vital signs in last 24 hours: Temp:  [97.8 F (36.6 C)-98.3 F (36.8 C)] 98.1 F (36.7 C) (12/28 0616) Pulse Rate:  [55-70] 70  (12/28 0616) Resp:  [16-18] 18  (12/28 0616) BP: (88-118)/(46-73) 101/62 mmHg (12/28 0616) SpO2:  [97 %-98 %] 98 % (12/27 2130)  Physical Exam:  General: alert, cooperative and appears stated age Lochia: appropriate Uterine Fundus: firm Incision: healing well, no significant drainage, no dehiscence, no significant erythema DVT Evaluation: No evidence of DVT seen on physical exam. Negative Homan's sign. No cords or calf tenderness.   Basename 11/11/12 0530 11/10/12 1330  HGB 9.3* 12.1  HCT 26.8* 35.3*    Assessment/Plan: Status post Cesarean section. Doing well postoperatively.  Continue current care.  Brandi [name] 11/12/2012, 8:26 AM

## 2012-11-13 MED ORDER — OXYCODONE-ACETAMINOPHEN 5-325 MG PO TABS: 1.0000 | ORAL_TABLET | ORAL | Status: DC | PRN

## 2012-11-13 MED ORDER — IBUPROFEN 600 MG PO TABS: 600.0000 mg | ORAL_TABLET | Freq: Four times a day (QID) | ORAL | Status: AC

## 2012-11-13 NOTE — Progress Notes (Signed)
Subjective: Postpartum Day 3: Cesarean Delivery Patient reports tolerating PO and no problems voiding.    Objective: Vital signs in last 24 hours: Temp:  [98.2 F (36.8 C)-99.3 F (37.4 C)] 98.2 F (36.8 C) (12/29 9147) Pulse Rate:  [73-81] 81  (12/29 0623) Resp:  [18] 18  (12/29 0623) BP: (103-129)/(60-76) 103/60 mmHg (12/29 8295)  Physical Exam:  General: alert, cooperative and appears stated age Lochia: appropriate Uterine Fundus: firm Incision: healing well, no significant drainage, no dehiscence, no significant erythema DVT Evaluation: No evidence of DVT seen on physical exam. Negative Homan's sign. No cords or calf tenderness.   Basename 11/11/12 0530 11/10/12 1330  HGB 9.3* 12.1  HCT 26.8* 35.3*    Assessment/Plan: Status post Cesarean section. Doing well postoperatively.  Discharge home with standard precautions and return to clinic in 4-6 weeks.  Ellene Bloodsaw 11/13/2012, 8:12 AM

## 2012-11-13 NOTE — Discharge Summary (Signed)
Obstetric Discharge Summary Reason for Admission: onset of labor Prenatal Procedures: none Intrapartum Procedures: cesarean: low cervical, transverse Postpartum Procedures: none Complications-Operative and Postpartum: none Hemoglobin  Date Value Range Status  11/11/2012 9.3* 12.0 - 15.0 g/dL Final     DELTA CHECK NOTED     REPEATED TO VERIFY     HCT  Date Value Range Status  11/11/2012 26.8* 36.0 - 46.0 % Final    Physical Exam:  General: alert, cooperative and appears stated age 36: appropriate Uterine Fundus: firm Incision: healing well, no significant drainage, no dehiscence, no significant erythema DVT Evaluation: No evidence of DVT seen on physical exam.  Discharge Diagnoses: Term Pregnancy-delivered  Discharge Information: Date: 11/13/2012 Activity: pelvic rest Diet: routine Medications: PNV, Ibuprofen and Percocet Condition: stable Instructions: refer to practice specific booklet Discharge to: home   Newborn Data: Live born female  Birth Weight: 8 lb 1.1 oz (3660 g) APGAR: 9, 10  Home with mother.  Brandi [name] 11/13/2012, 8:16 AM

## 2012-11-14 NOTE — Progress Notes (Signed)
Post discharge chart review completed.  

## 2012-11-21 ENCOUNTER — Inpatient Hospital Stay: Admit: 2012-11-21 | Payer: Self-pay | Admitting: Obstetrics and Gynecology

## 2012-11-21 SURGERY — Surgical Case
Anesthesia: Regional | Laterality: Bilateral

## 2012-11-23 ENCOUNTER — Ambulatory Visit (HOSPITAL_COMMUNITY)
Admit: 2012-11-23 | Discharge: 2012-11-23 | Disposition: A | Discharge status: Home / Self Care | Payer: 59 | Attending: Obstetrics and Gynecology | Admitting: Obstetrics and Gynecology

## 2012-11-23 NOTE — Progress Notes (Signed)
Adult Lactation Consultation Outpatient Visit Note  Patient Name: Brandi [name]                              Brandi [name], DOB [birth year] Birth Weight 8 lb. 1 oz,  Date of Birth: [birth year]                                               Now 68 days old Gestational Age at Delivery: Unknown Type of Delivery: C/S  Breastfeeding History: Frequency of Breastfeeding: every 2- 2 1/2 hours during the day, bottles at night 30 ml for 2 feedings. Length of Feeding: 15-20 minutes Voids: 4-5/day Stools: 2-3/day, yellow/brown in color  Supplementing / Method: Pumping:  Type of Pump:  Medela Pump N Style   Frequency:  4-5 times day  Volume: Pumping 1 breast 15-20 ml, if pumps both breasts to give bottle for feeding, Mom reports getting 30-40 ml of EBM   Comments: Mom is here today for feeding assessment. She has been using #24 nipple shield to latch Brandi. She is c/o of pain with breastfeeding, no breakdown reported. She is also concerned about milk supply. She did not produce enough milk for her twins and had difficulty with latching her twins as well. She reports her breasts fill between feedings but she never feels engorged. Last weight check at [facility] the Tuesday after his birth, 11/15/12, his weight was 7 lb. 2.5 oz.    Consultation Evaluation: Brandi Mac was awake and alert at this visit. When Mom went to latch Brandi Mac it was noted the #24 nipple shield is too large. Mom declined to attempt latching the Brandi without the nipple shield, she reports it is too painful and he will not latch. Changed nipple shield to size #20, Brandi Mac latched easily and Mom reports this feels better on her breast. Brandi Mac demonstrated a good suckling rhythm, some adjustment needed to keep his bottom lip down. Breast milk visible in the nipple shield at the end of the feeding.   Initial Feeding Assessment: Pre-feed Weight:  7 lb. 0.4 oz/3188 gm Post-feed Weight:  7 lb. 1.2 oz/ 3208 gm Amount Transferred:  20  ml Comments:  From right breast using #20 nipple shield  Additional Feeding Assessment: Pre-feed Weight:  7 lb. 1.2 oz/3208 gm Post-feed Weight:  7 lb. 1.7 oz/3224 gm Amount Transferred:  16 ml. Comments:  From left breast   Additional Feeding Assessment: Pre-feed Weight: Post-feed Weight: Amount Transferred: Comments:  Total Breast milk Transferred this Visit: 36 ml. Total Supplement Given: 30 ml of Similac  Additional Interventions: Discussed Brandi Mac's weight loss with Mom and according to milk transfer at this visit and weight loss, Mom needs to supplement after each feeding till her milk supply increases and Brandi Mac is transferring breast milk more efficiently. Plan given to Mom: Breast feed every 2-3 hours, keep Brandi active at the breast for 15-20 minutes. Use the #20 nipple shield to latch Brandi Mac. Post pump both breast for 15 minutes. Supplement after each feeding 30-40 ml of EBM or formula. If the Brandi does not breast feed at a feeding, increase supplement to 60-90 ml. Monitor voids/stools. Consider supplements to support and increase breast milk. Discussed More Milk Plus from [employer]. Discussed importance of breastfeeding/pumping, emptying breast to  milk supply and production.   Follow-Up Lactation OP appointment 12/02/12 at 10:30 Come to Support Group on 11/29/12 at 11:00 for weight check.     Alfred Levins 11/23/2012, 9:41 AM

## 2012-11-23 NOTE — Progress Notes (Deleted)
Adult Lactation Consultation Outpatient Visit Note  Patient Name: Brandi [name] Date of Birth: 1976/10/14 Gestational Age at Delivery: Unknown Type of Delivery:   Breastfeeding History: Frequency of Breastfeeding:  Length of Feeding:  Voids:  Stools:   Supplementing / Method: Pumping:  Type of Pump:   Frequency:  Volume:    Comments:    Consultation Evaluation:  Initial Feeding Assessment: Pre-feed Weight: Post-feed Weight: Amount Transferred: Comments:  Additional Feeding Assessment: Pre-feed Weight: Post-feed Weight: Amount Transferred: Comments:  Additional Feeding Assessment: Pre-feed Weight: Post-feed Weight: Amount Transferred: Comments:  Total Breast milk Transferred this Visit:  Total Supplement Given:   Additional Interventions:   Follow-Up      Alfred Levins 11/23/2012, 8:30 AM

## 2012-12-01 ENCOUNTER — Ambulatory Visit (HOSPITAL_COMMUNITY): Payer: 59

## 2012-12-02 ENCOUNTER — Ambulatory Visit (HOSPITAL_COMMUNITY)
Admission: RE | Admit: 2012-12-02 | Discharge: 2012-12-02 | Disposition: A | Discharge status: Home / Self Care | Payer: 59 | Source: Ambulatory Visit | Attending: Obstetrics and Gynecology | Admitting: Obstetrics and Gynecology

## 2012-12-02 NOTE — Progress Notes (Signed)
Adult Lactation Consultation Outpatient Visit Note : Follow up low milk supply, weight loss  Patient Name: Raymon Mutton Usery(mother)  BABY: Townsend Roger Date of Birth: 07/07/1976  Gestational Age at Delivery: 38.4                                                                       DOB: [birth year] Type of Delivery: C/S                                    BIRTH WEIGHT: 8-1                                                                        WEIGHT TODAY: 8-0.7 Breastfeeding History: Frequency of Breastfeeding: every 3 hours during the day Length of Feeding: 12 minutes each breast Voids: qs Stools: qs  Supplementing / Method:formula 2 oz pc every 3 hours Pumping:  Type of Pump:PIS   Frequency:once per day in place of feeding  Volume: 30-40 mls  Comments:    Consultation Evaluation:Baby has gained 16 oz in 1 week. Mom is very happy how feedings are going and OK with needing to supplement.  Mom has 63 year old twins at home and she admits it would be difficult to do a lot of pumping or complicated plans.  Baby is very alert and nursed actively on both breasts with 20 mm nipple shield.  Baby obtaining deep latch and mom comfortable with feedings.  Discussed with mom that as baby grows she may want to try her 24 mm shield to allow baby to obtain deepest latch possible.  Discussed that now baby is energized from great weight gain and nursing so well her supply will most likely increase.  Mom will call if she desires another OP appointment.  Initial Feeding Assessment:15 minutes left, 20 minutes left Pre-feed [id number] Post-feed [id number] Amount Transferred:40 mls Comments:  Additional Feeding Assessment: Pre-feed Weight: Post-feed Weight: Amount Transferred: Comments:  Additional Feeding Assessment: Pre-feed Weight: Post-feed Weight: Amount Transferred: Comments:  Total Breast milk Transferred this Visit: 40 mls Total Supplement Given: 60 mls formula  Additional  Interventions:   Follow-Up will call prn      Hansel Feinstein 12/02/2012, 9:38 AM

## 2012-12-20 ENCOUNTER — Other Ambulatory Visit: Payer: Self-pay | Admitting: Obstetrics and Gynecology

## 2014-03-15 ENCOUNTER — Other Ambulatory Visit: Payer: Self-pay | Admitting: Obstetrics and Gynecology

## 2014-08-28 ENCOUNTER — Encounter: Payer: Self-pay | Admitting: Internal Medicine

## 2014-09-17 ENCOUNTER — Encounter (HOSPITAL_COMMUNITY): Payer: Self-pay | Admitting: Obstetrics and Gynecology

## 2014-10-29 ENCOUNTER — Encounter: Payer: Self-pay | Admitting: *Deleted

## 2014-10-29 ENCOUNTER — Other Ambulatory Visit: Payer: Self-pay | Admitting: Internal Medicine

## 2014-11-01 ENCOUNTER — Encounter: Payer: 59 | Admitting: Internal Medicine

## 2014-11-01 ENCOUNTER — Encounter: Payer: Self-pay | Admitting: *Deleted

## 2014-11-01 NOTE — Progress Notes (Signed)
The patient's chart has been reviewed by Dr. Hilarie Fredrickson  and the recommendations are noted below.  Follow-up advised. Schedule patient for next available appointment.  Outcome of communication with the patient:   Phone number provided in our system is disconnected. I will send a letter.

## 2014-11-01 NOTE — Progress Notes (Signed)
This encounter was created in error - please disregard.

## 2015-01-11 ENCOUNTER — Encounter: Payer: Self-pay | Admitting: Internal Medicine

## 2015-01-11 ENCOUNTER — Ambulatory Visit (INDEPENDENT_AMBULATORY_CARE_PROVIDER_SITE_OTHER): Payer: 59 | Admitting: Internal Medicine

## 2015-01-11 VITALS — BP 96/56 | HR 60 | Ht 62.75 in | Wt 130.0 lb

## 2015-01-11 DIAGNOSIS — Z800 Family history of malignant neoplasm of digestive organs: Secondary | ICD-10-CM | POA: Insufficient documentation

## 2015-01-11 NOTE — Progress Notes (Signed)
Patient ID: Brandi [name], female   DOB: 04/09/1976, 39 y.o.   MRN: [id number] HPI: Brandi [name] is a 39 yo female with PMH of endometriosis and migraines with a family history significant for colorectal cancer in her mother at age 47 who is seen in consultation at the request of Joni Reining, PA-C to discuss elevated risk screening colonoscopy. She is here alone today. She reports her mother was diagnosed with stage II colon cancer at age 13. No other family history of colon polyps or colon cancer to her knowledge. Her dad has a lung nodule which is currently being evaluated. She does have a strong family history of coronary artery disease on both sides of her family. She reports she is feeling well and she denies specific GI complaint. She reports regular bowel habits with no change in bowel function. She denies blood in her stool or melena. No abdominal pain. No diarrhea or constipation. No nausea or vomiting. Stable weight. Good appetite. No trouble with heartburn, dysphagia or odynophagia. She denies hepatobiliary complaint.    Past Medical History  Diagnosis Date  . Insomnia   . Endometriosis   . Migraines     Past Surgical History  Procedure Laterality Date  . Cesarean section with bilateral tubal ligation  11/10/2012    Procedure: CESAREAN SECTION WITH BILATERAL TUBAL LIGATION;  Surgeon: Cyril Mourning, MD;  Location: [facility];  Service: Obstetrics;  Laterality: Bilateral;  . Cesarean section  2009  . Tubees tied  2013    Outpatient Prescriptions Prior to Visit  Medication Sig Dispense Refill  . ibuprofen (ADVIL,MOTRIN) 600 MG tablet Take 1 tablet (600 mg total) by mouth every 6 (six) hours. 30 tablet 0  . loratadine (CLARITIN) 10 MG tablet Take 10 mg by mouth daily.    . diphenhydramine-acetaminophen (TYLENOL PM) 25-500 MG TABS Take 1 tablet by mouth at bedtime as needed. For sleep    . oxyCODONE-acetaminophen (PERCOCET/ROXICET) 5-325 MG per tablet Take 1-2 tablets by  mouth every 4 (four) hours as needed (moderate - severe pain). 40 tablet 0  . phenylephrine-shark liver oil-mineral oil-petrolatum (PREPARATION H) 0.25-3-14-71.9 % rectal ointment Place 1 application rectally 2 (two) times daily as needed. For hemorrhoids    . Prenatal Vit-Fe Fumarate-FA (MULTIVITAMIN-PRENATAL) 27-0.8 MG TABS Take 1 tablet by mouth daily.     No facility-administered medications prior to visit.    No Known Allergies  Family History  Problem Relation Age of Onset  . Colon cancer Mother 50  . Heart disease Father   . Multiple sclerosis Mother   . Stroke Mother   . Prostate cancer Father   . Colon polyps Mother   . Kidney disease Neg Hx   . Diabetes Neg Hx   . Gallbladder disease Neg Hx   . Esophageal cancer Neg Hx     History  Substance Use Topics  . Smoking status: Former Smoker -- 0.25 packs/day for 4 years    Types: Cigarettes    Quit date: 11/16/1998  . Smokeless tobacco: Never Used  . Alcohol Use: 0.0 oz/week    0 Standard drinks or equivalent per week     Comment: occassionally    ROS: As per history of present illness, otherwise negative  BP 96/56 mmHg  Pulse 60  Ht 5' 2.75" (1.594 m)  Wt 130 lb (58.968 kg)  BMI 23.21 kg/m2  LMP 01/11/2015 Constitutional: Well-developed and well-nourished. No distress. HEENT: Normocephalic and atraumatic. Oropharynx is clear and moist. No oropharyngeal exudate.  Conjunctivae are normal.  No scleral icterus. Neck: Neck supple. Trachea midline. Cardiovascular: Normal rate, regular rhythm and intact distal pulses. No M/R/G Pulmonary/chest: Effort normal and breath sounds normal. No wheezing, rales or rhonchi. Abdominal: Soft, nontender, nondistended. Bowel sounds active throughout. There are no masses palpable. No hepatosplenomegaly. Extremities: no clubbing, cyanosis, or edema Lymphadenopathy: No cervical adenopathy noted. Neurological: Alert and oriented to person place and time. Skin: Skin is warm and dry. No  rashes noted. Psychiatric: Normal mood and affect. Behavior is normal.  RELEVANT LABS AND IMAGING: Labs from oct 2015 tsh 3.3 CMP within normal limits, hemoglobin 15.5, hematocrit 44.2, MCV 92, platelet count 256, WBC 6.1   ASSESSMENT/PLAN: 39 yo female with PMH of endometriosis and migraines with a family history significant for colorectal cancer in her mother at age 85 who is seen in consultation at the request of Joni Reining, PA-C to discuss elevated risk screening colonoscopy.  1. Family history of colon cancer requiring screening colonoscopy -- with a long discussion today regarding current guidelines as it relates to colorectal cancer screening in patients with first degree family members with colorectal cancer. Her mother's cancer was diagnosed at an early age, specifically 72 years. Guidelines support beginning screening colonoscopy at age 31 in her case. We discussed this and she agrees with this recommendation. We discussed the risks, benefits and alternatives to colonoscopy and after this discussion she understands and is agreeable to proceed. We will place a reminder for screening colonoscopy for next February 2017 when she will be 39 years old. I asked that she notify me should she develop any change in her bowel habit, blood in her stool or abdominal pain prior to this and she voiced understanding   Cc:Aura Dials, [address], [place] [address]

## 2015-01-11 NOTE — Patient Instructions (Signed)
You will be due for a recall colonoscopy in 12/2015. We will send you a reminder in the mail when it gets closer to that time.

## 2015-12-27 ENCOUNTER — Encounter: Payer: Self-pay | Admitting: Internal Medicine

## 2016-10-30 ENCOUNTER — Encounter: Payer: Self-pay | Admitting: Internal Medicine

## 2017-01-07 ENCOUNTER — Encounter: Payer: 59 | Admitting: Internal Medicine

## 2017-02-22 ENCOUNTER — Ambulatory Visit (AMBULATORY_SURGERY_CENTER): Payer: Self-pay

## 2017-02-22 VITALS — Ht 63.0 in | Wt 145.6 lb

## 2017-02-22 DIAGNOSIS — Z800 Family history of malignant neoplasm of digestive organs: Secondary | ICD-10-CM

## 2017-02-22 MED ORDER — SUPREP BOWEL PREP KIT 17.5-3.13-1.6 GM/177ML PO SOLN
1.0000 | Freq: Once | ORAL | 0 refills | Status: AC
Start: 2017-02-22 — End: 2017-02-22

## 2017-02-22 NOTE — Progress Notes (Signed)
No allergies to eggs or soy No diet meds No home oxygen No past problems with anesthesia  Registered for emmi 

## 2017-02-25 ENCOUNTER — Encounter: Payer: Self-pay | Admitting: Internal Medicine

## 2017-03-08 ENCOUNTER — Encounter: Payer: Self-pay | Admitting: Internal Medicine

## 2017-03-08 ENCOUNTER — Ambulatory Visit (AMBULATORY_SURGERY_CENTER): Payer: 59 | Admitting: Internal Medicine

## 2017-03-08 VITALS — BP 89/48 | HR 62 | Temp 98.0°F | Resp 25 | Ht 63.0 in | Wt 145.0 lb

## 2017-03-08 DIAGNOSIS — K635 Polyp of colon: Secondary | ICD-10-CM

## 2017-03-08 DIAGNOSIS — Z1211 Encounter for screening for malignant neoplasm of colon: Secondary | ICD-10-CM | POA: Diagnosis not present

## 2017-03-08 DIAGNOSIS — Z800 Family history of malignant neoplasm of digestive organs: Secondary | ICD-10-CM | POA: Diagnosis present

## 2017-03-08 DIAGNOSIS — Z1212 Encounter for screening for malignant neoplasm of rectum: Secondary | ICD-10-CM | POA: Diagnosis not present

## 2017-03-08 DIAGNOSIS — D123 Benign neoplasm of transverse colon: Secondary | ICD-10-CM

## 2017-03-08 MED ORDER — SODIUM CHLORIDE 0.9 % IV SOLN: 500.0000 mL | INTRAVENOUS | Status: AC

## 2017-03-08 MED ORDER — PRAMOXINE-HC 1-1 % EX CREA: TOPICAL_CREAM | Freq: Three times a day (TID) | CUTANEOUS | 1 refills | Status: AC

## 2017-03-08 NOTE — Progress Notes (Signed)
Called to room to assist during endoscopic procedure.  Patient ID and intended procedure confirmed with present staff. Received instructions for my participation in the procedure from the performing physician.  

## 2017-03-08 NOTE — Patient Instructions (Signed)
YOU HAD AN ENDOSCOPIC PROCEDURE TODAY AT THE [facility]:   Refer to the procedure report that was given to you for any specific questions about what was found during the examination.  If the procedure report does not answer your questions, please call your gastroenterologist to clarify.  If you requested that your care partner not be given the details of your procedure findings, then the procedure report has been included in a sealed envelope for you to review at your convenience later.  YOU SHOULD EXPECT: Some feelings of bloating in the abdomen. Passage of more gas than usual.  Walking can help get rid of the air that was put into your GI tract during the procedure and reduce the bloating. If you had a lower endoscopy (such as a colonoscopy or flexible sigmoidoscopy) you may notice spotting of blood in your stool or on the toilet paper. If you underwent a bowel prep for your procedure, you may not have a normal bowel movement for a few days.  Please Note:  You might notice some irritation and congestion in your nose or some drainage.  This is from the oxygen used during your procedure.  There is no need for concern and it should clear up in a day or so.  SYMPTOMS TO REPORT IMMEDIATELY:   Following lower endoscopy (colonoscopy or flexible sigmoidoscopy):  Excessive amounts of blood in the stool  Significant tenderness or worsening of abdominal pains  Swelling of the abdomen that is new, acute  Fever of 100F or higher   For urgent or emergent issues, a gastroenterologist can be reached at any hour by calling [phone].   DIET:  We do recommend a small meal at first, but then you may proceed to your regular diet.  Drink plenty of fluids but you should avoid alcoholic beverages for 24 hours.  ACTIVITY:  You should plan to take it easy for the rest of today and you should NOT DRIVE or use heavy machinery until tomorrow (because of the sedation medicines used during the test).     FOLLOW UP: Our staff will call the number listed on your records the next business day following your procedure to check on you and address any questions or concerns that you may have regarding the information given to you following your procedure. If we do not reach you, we will leave a message.  However, if you are feeling well and you are not experiencing any problems, there is no need to return our call.  We will assume that you have returned to your regular daily activities without incident.  If any biopsies were taken you will be contacted by phone or by letter within the next 1-3 weeks.  Please call us at [phone] if you have not heard about the biopsies in 3 weeks.    SIGNATURES/CONFIDENTIALITY: You and/or your care partner have signed paperwork which will be entered into your electronic medical record.  These signatures attest to the fact that that the information above on your After Visit Summary has been reviewed and is understood.  Full responsibility of the confidentiality of this discharge information lies with you and/or your care-partner.  Read all of the handouts given to you by your recovery room nurse. 

## 2017-03-08 NOTE — Progress Notes (Signed)
Report given to PACU, vss 

## 2017-03-08 NOTE — Op Note (Signed)
[facility] Patient Name: Brandi [name] Procedure Date: 03/08/2017 10:27 AM MRN: [id number] Endoscopist: Jerene Bears , MD Age: 41 Referring MD:  Date of Birth: 1976/02/15 Gender: Female Account #: [id number] Procedure:                Colonoscopy Indications:              Screening in patient at increased risk: Family                            history of 1st-degree relative with colorectal                            cancer before age 26 years, This is the patient's                            first colonoscopy Medicines:                Monitored Anesthesia Care Procedure:                Pre-Anesthesia Assessment:                           - Prior to the procedure, a History and Physical                            was performed, and patient medications and                            allergies were reviewed. The patient's tolerance of                            previous anesthesia was also reviewed. The risks                            and benefits of the procedure and the sedation                            options and risks were discussed with the patient.                            All questions were answered, and informed consent                            was obtained. Prior Anticoagulants: The patient has                            taken no previous anticoagulant or antiplatelet                            agents. ASA Grade Assessment: II - A patient with                            mild systemic disease. After reviewing the risks  and benefits, the patient was deemed in                            satisfactory condition to undergo the procedure.                           After obtaining informed consent, the colonoscope                            was passed under direct vision. Throughout the                            procedure, the patient's blood pressure, pulse, and                            oxygen saturations were monitored continuously.  The                            Colonoscope was introduced through the anus and                            advanced to the the terminal ileum. The colonoscopy                            was performed without difficulty. The patient                            tolerated the procedure well. The quality of the                            bowel preparation was excellent. The terminal                            ileum, ileocecal valve, appendiceal orifice, and                            rectum were photographed. Scope In: 10:44:42 AM Scope Out: 10:59:50 AM Scope Withdrawal Time: 0 hours 12 minutes 1 second  Total Procedure Duration: 0 hours 15 minutes 8 seconds  Findings:                 The digital rectal exam was normal.                           The terminal ileum appeared normal.                           Two flat polyps were found in the hepatic flexure.                            The polyps were 2 to 6 mm in size. These polyps                            were removed with a cold snare. Resection and  retrieval were complete.                           The exam was otherwise without abnormality on                            direct and retroflexion views. Complications:            No immediate complications. Estimated Blood Loss:     Estimated blood loss was minimal. Impression:               - The examined portion of the ileum was normal.                           - Two 2 to 6 mm polyps at the hepatic flexure,                            removed with a cold snare. Resected and retrieved.                           - The examination was otherwise normal on direct                            and retroflexion views. Recommendation:           - Patient has a contact number available for                            emergencies. The signs and symptoms of potential                            delayed complications were discussed with the                            patient.  Return to normal activities tomorrow.                            Written discharge instructions were provided to the                            patient.                           - Resume previous diet.                           - Continue present medications.                           - Await pathology results.                           - Repeat colonoscopy is recommended. The                            colonoscopy date will be determined after pathology  results from today's exam become available for                            review. Jerene Bears, MD 03/08/2017 11:04:33 AM This report has been signed electronically.

## 2017-03-09 ENCOUNTER — Telehealth: Payer: Self-pay

## 2017-03-09 ENCOUNTER — Telehealth: Payer: Self-pay | Admitting: *Deleted

## 2017-03-09 NOTE — Telephone Encounter (Signed)
  Follow up Call-  Call back number 03/08/2017  Post procedure Call Back phone  # [phone]  Permission to leave phone message Yes  Some recent data might be hidden     Tried leaving a message but the mailbox was full, will try again later.

## 2017-03-09 NOTE — Telephone Encounter (Signed)
  Follow up Call-  Call back number 03/08/2017  Post procedure Call Back phone  # [phone]  Permission to leave phone message Yes  Some recent data might be hidden     Patient questions:  Do you have a fever, pain , or abdominal swelling? No. Pain Score  0 *  Have you tolerated food without any problems? Yes.    Have you been able to return to your normal activities? Yes.    Do you have any questions about your discharge instructions: Diet   No. Medications  No. Follow up visit  No.  Do you have questions or concerns about your Care? No.  Actions: * If pain score is 4 or above: No action needed, pain <4.

## 2017-03-12 ENCOUNTER — Encounter: Payer: Self-pay | Admitting: Internal Medicine

## 2019-08-03 ENCOUNTER — Other Ambulatory Visit: Payer: Self-pay | Admitting: *Deleted

## 2019-08-03 DIAGNOSIS — Z20822 Contact with and (suspected) exposure to covid-19: Secondary | ICD-10-CM

## 2019-08-05 LAB — NOVEL CORONAVIRUS, NAA: SARS-CoV-2, NAA: NOT DETECTED

## 2019-08-07 ENCOUNTER — Telehealth: Payer: Self-pay | Admitting: General Practice

## 2019-08-07 NOTE — Telephone Encounter (Signed)
Negative COVID results given. Patient results "NOT Detected." Caller expressed understanding. ° °

## 2019-09-27 ENCOUNTER — Other Ambulatory Visit: Payer: Self-pay | Admitting: Obstetrics and Gynecology

## 2019-09-27 DIAGNOSIS — R928 Other abnormal and inconclusive findings on diagnostic imaging of breast: Secondary | ICD-10-CM

## 2019-09-29 ENCOUNTER — Ambulatory Visit
Admission: RE | Admit: 2019-09-29 | Discharge: 2019-09-29 | Disposition: A | Discharge status: Home / Self Care | Payer: 59 | Source: Ambulatory Visit | Attending: Obstetrics and Gynecology | Admitting: Obstetrics and Gynecology

## 2019-09-29 ENCOUNTER — Other Ambulatory Visit: Payer: Self-pay

## 2019-09-29 DIAGNOSIS — R928 Other abnormal and inconclusive findings on diagnostic imaging of breast: Secondary | ICD-10-CM

## 2019-10-09 ENCOUNTER — Other Ambulatory Visit: Payer: Self-pay

## 2019-10-09 DIAGNOSIS — Z20822 Contact with and (suspected) exposure to covid-19: Secondary | ICD-10-CM

## 2019-10-11 LAB — NOVEL CORONAVIRUS, NAA: SARS-CoV-2, NAA: NOT DETECTED

## 2019-11-20 ENCOUNTER — Other Ambulatory Visit: Payer: 59

## 2019-11-20 ENCOUNTER — Ambulatory Visit: Payer: 59 | Attending: Internal Medicine

## 2019-11-20 DIAGNOSIS — Z20822 Contact with and (suspected) exposure to covid-19: Secondary | ICD-10-CM

## 2019-11-22 LAB — NOVEL CORONAVIRUS, NAA: SARS-CoV-2, NAA: NOT DETECTED

## 2019-11-23 ENCOUNTER — Other Ambulatory Visit: Payer: 59

## 2020-08-31 IMAGING — MG MM DIGITAL DIAGNOSTIC UNILAT*R* W/ TOMO W/ CAD
4 series · 4 of 12 positions shown · non-contrast
Comparison: 09/25/2019 and earlier

CLINICAL DATA: Patient returns after screening study for evaluation
of possible RIGHT breast mass and possible subtle architectural
distortion.

EXAM:
DIGITAL DIAGNOSTIC RIGHT MAMMOGRAM WITH CAD AND TOMO
ULTRASOUND RIGHT BREAST

[R CC synth-2D]
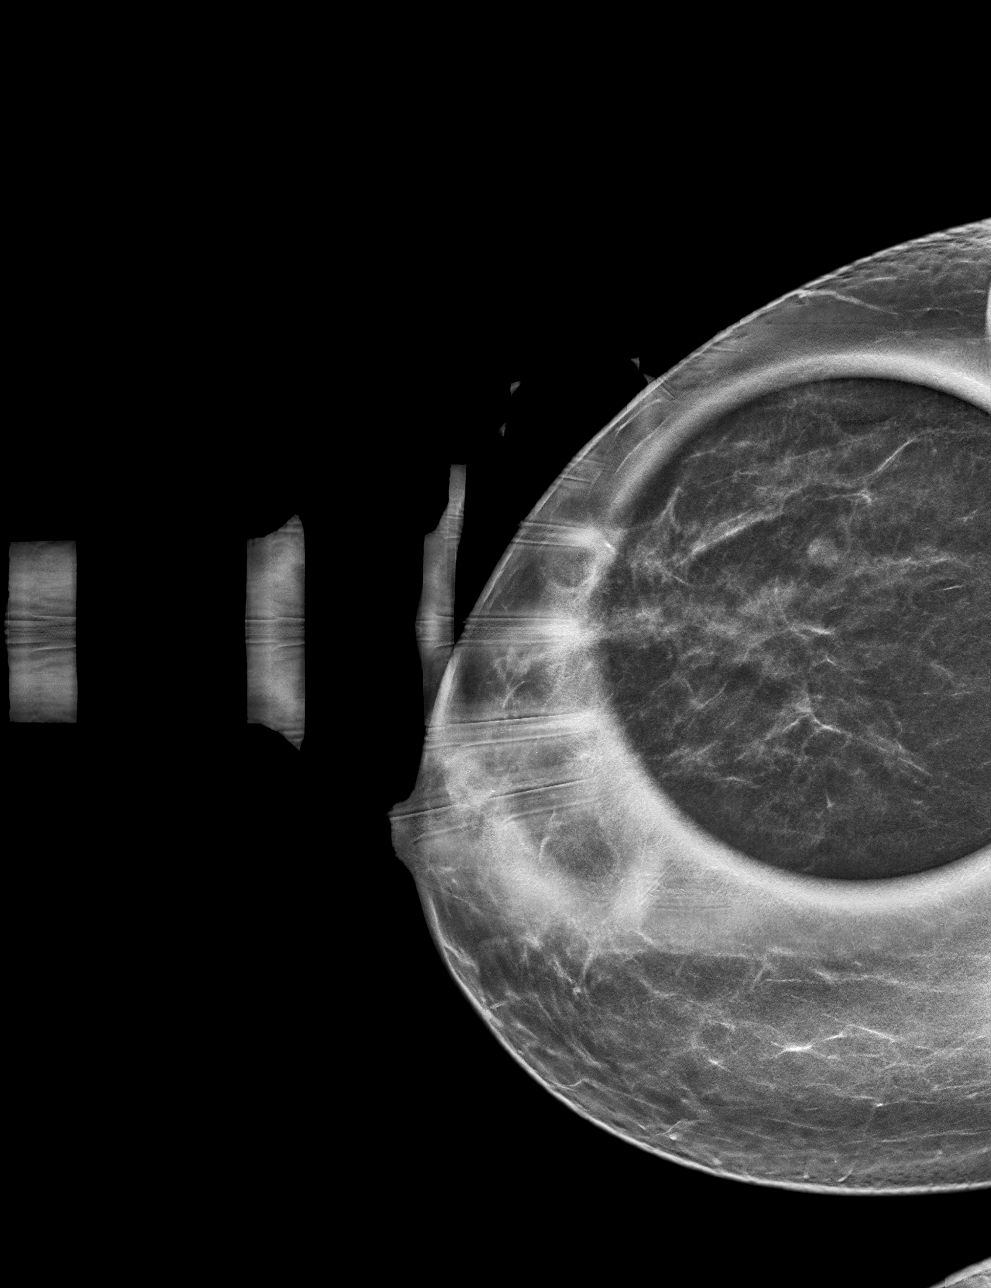

[R MLO synth-2D]
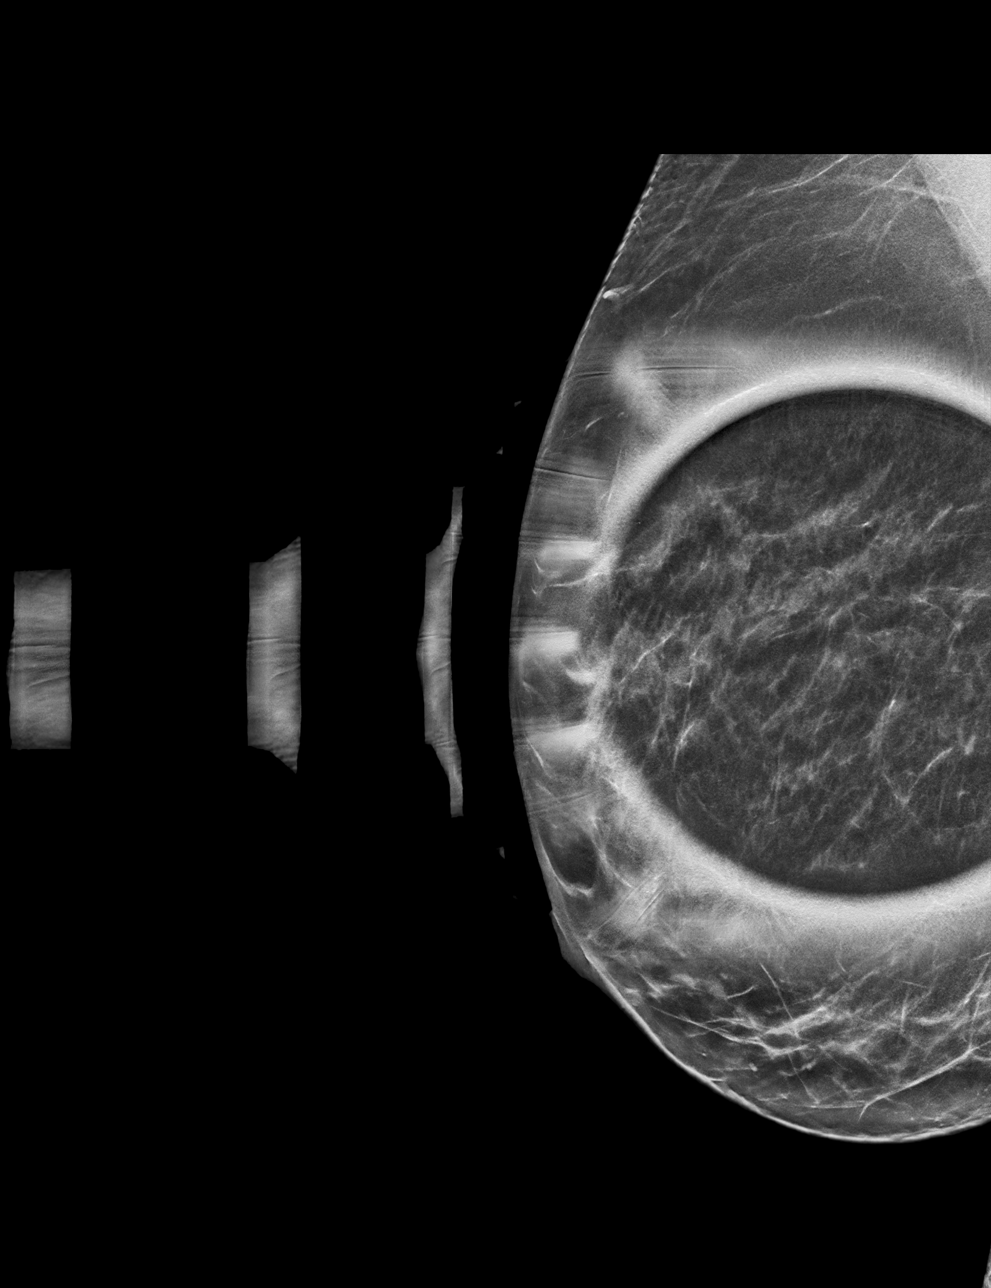

[R CC tomo · tomo slice 29/56.0]
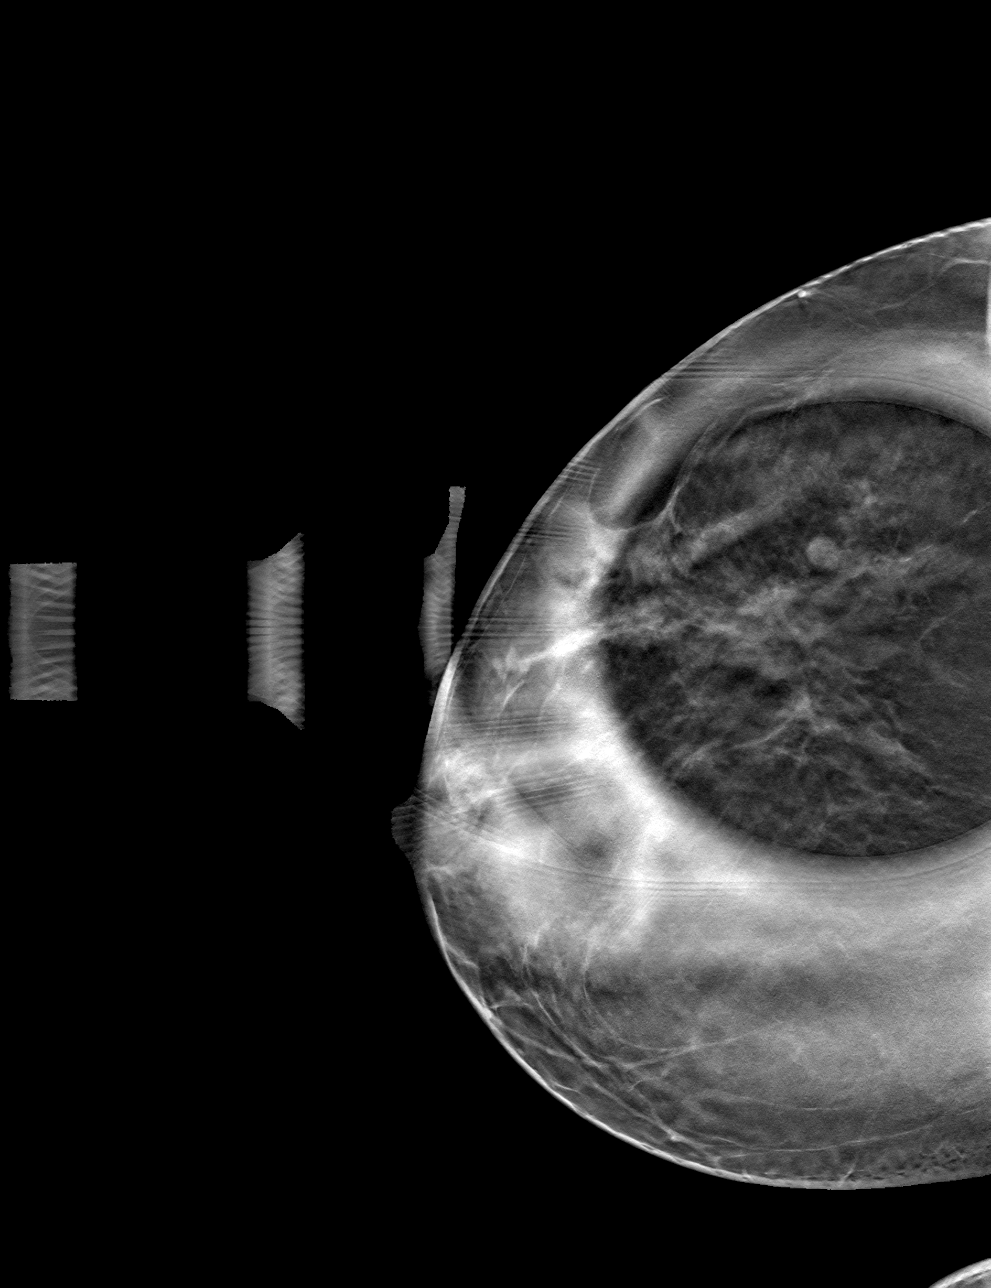

[R MLO tomo · tomo slice 29/58.0]
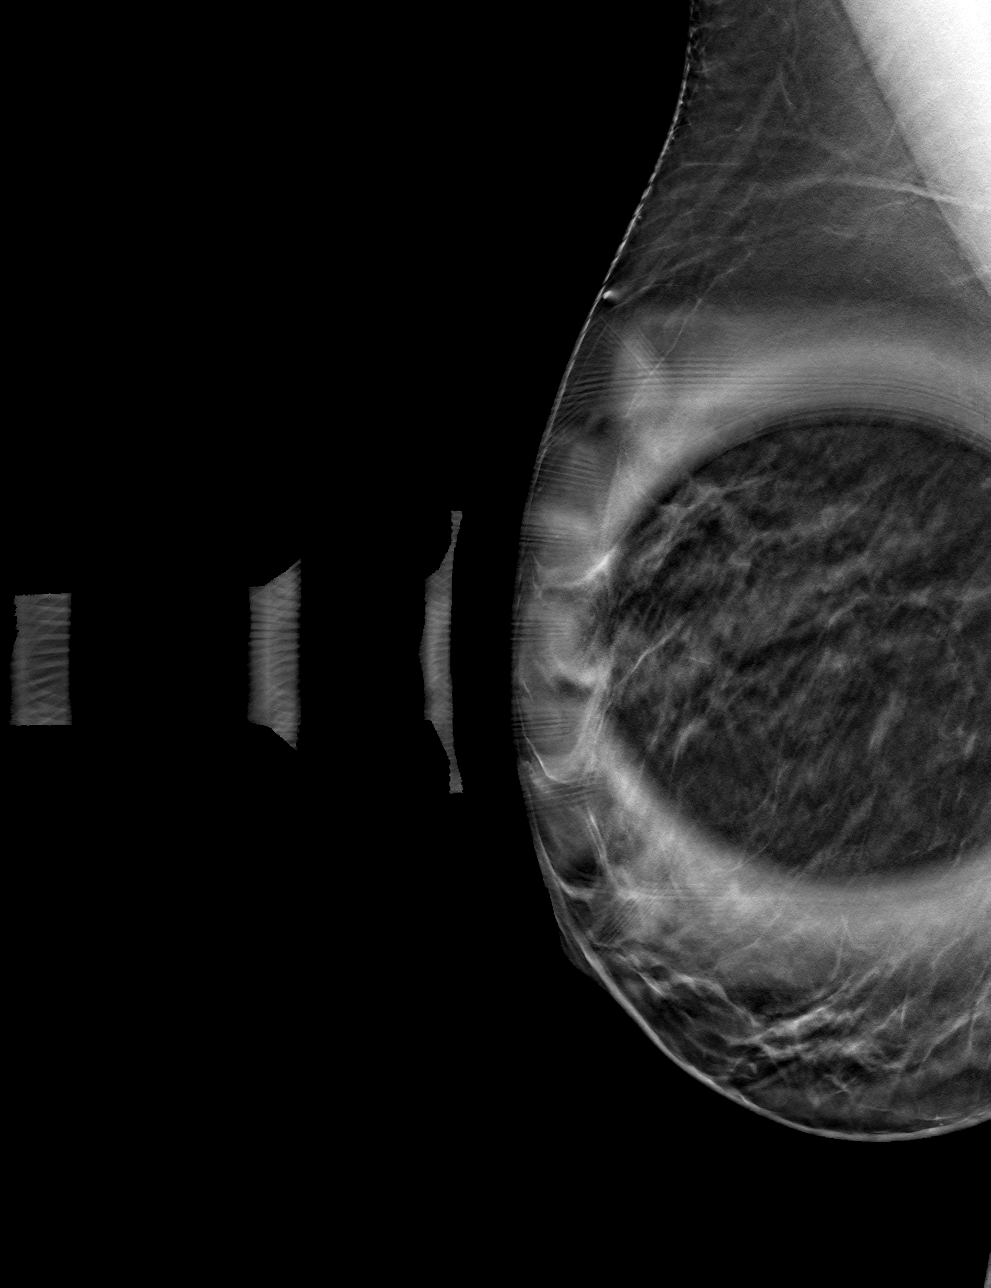

[4 of 12 positions shown; findings below may reference images not displayed]

ACR Breast Density Category c: The breast tissue is heterogeneously
dense, which may obscure small masses.
FINDINGS: Additional 2-D and 3-D images are performed. These views confirm
presence of a 5 millimeter circumscribed oval mass in the LATERAL
portion of the RIGHT breast and further evaluated with ultrasound.

Mammographic images were processed with CAD.

Targeted ultrasound is performed, showing a circumscribed anechoic
oval mass in the 10 o'clock location of the RIGHT breast 5
centimeters from the nipple which measures 0.7 x 0.4 x
centimeters. An adjacent circumscribed oval anechoic mass is 0.6 x
0.4 x 0.4 centimeters. No solid masses or areas of acoustic
shadowing.
IMPRESSION: Benign simple cysts in the area of concern in the UPPER-OUTER
QUADRANT of the RIGHT breast. No mammographic or ultrasound evidence
for malignancy.

RECOMMENDATION:
Screening mammogram in one year.(Code:56-M-3X9)

I have discussed the findings and recommendations with the patient.
If applicable, a reminder letter will be sent to the patient
regarding the next appointment.

BI-RADS CATEGORY  2: Benign.

## 2020-08-31 IMAGING — US US BREAST*R* LIMITED INC AXILLA
2 series · 10 of 10 positions shown · non-contrast
Comparison: 09/25/2019 and earlier

CLINICAL DATA: Patient returns after screening study for evaluation
of possible RIGHT breast mass and possible subtle architectural
distortion.

EXAM:
DIGITAL DIAGNOSTIC RIGHT MAMMOGRAM WITH CAD AND TOMO
ULTRASOUND RIGHT BREAST

[Series 1: us breast*right* limited inc axilla · 0.07mm/px · 8 of 8 slices shown (1 of 2)]
[im 1/8]
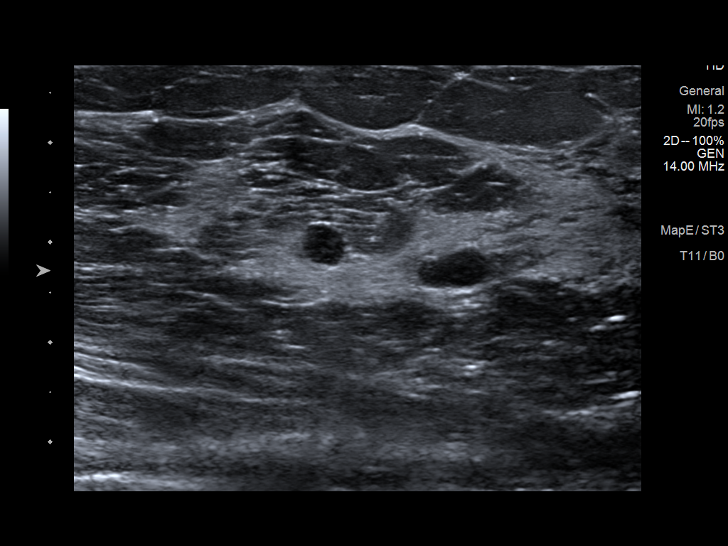
[im 2/8]
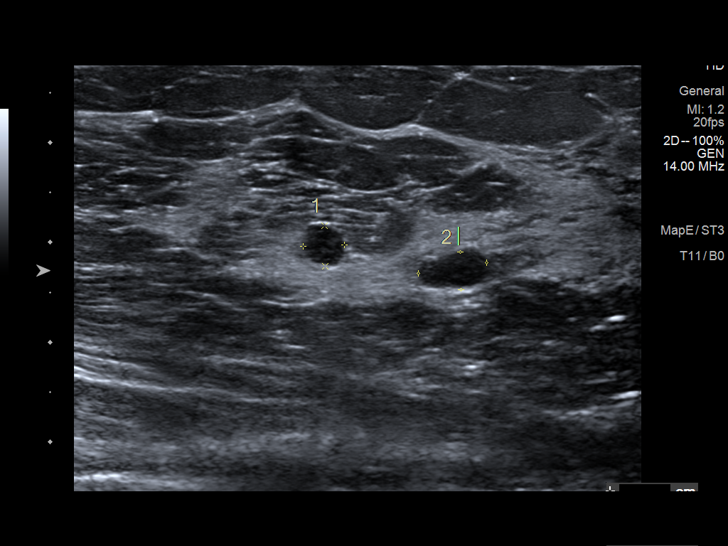
[im 3/8]
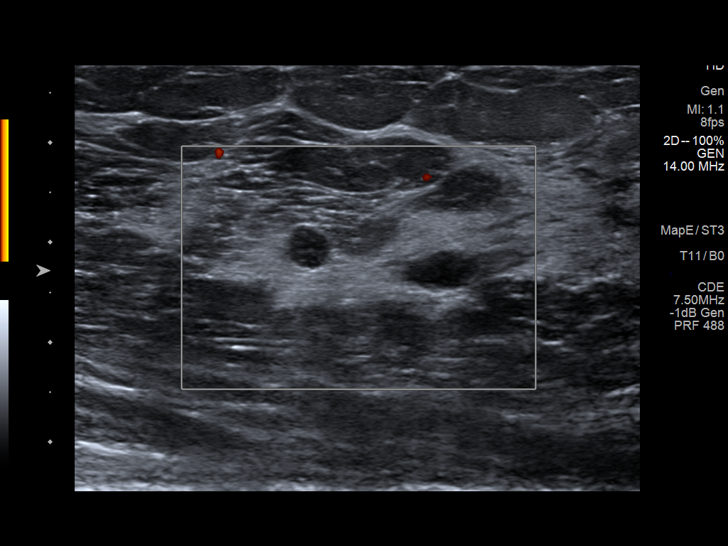
[im 4/8]
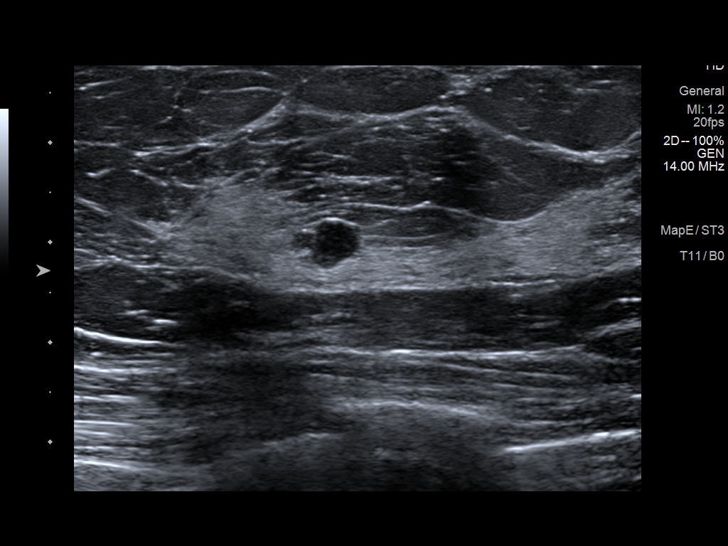
[im 5/8]
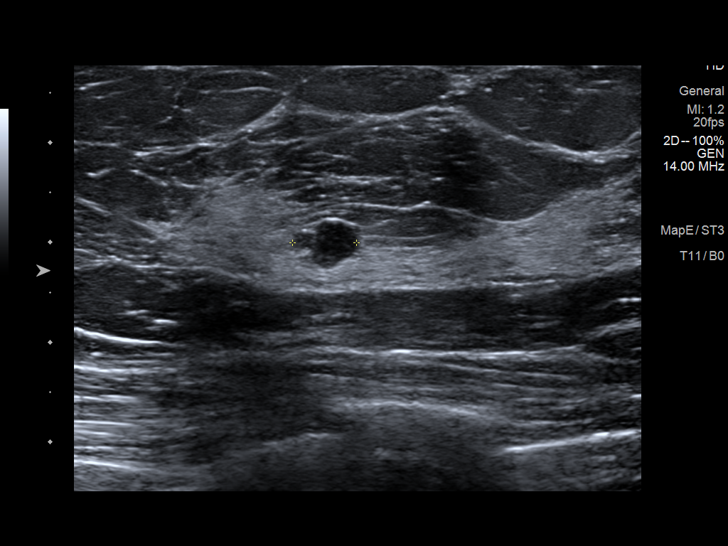
[im 6/8]
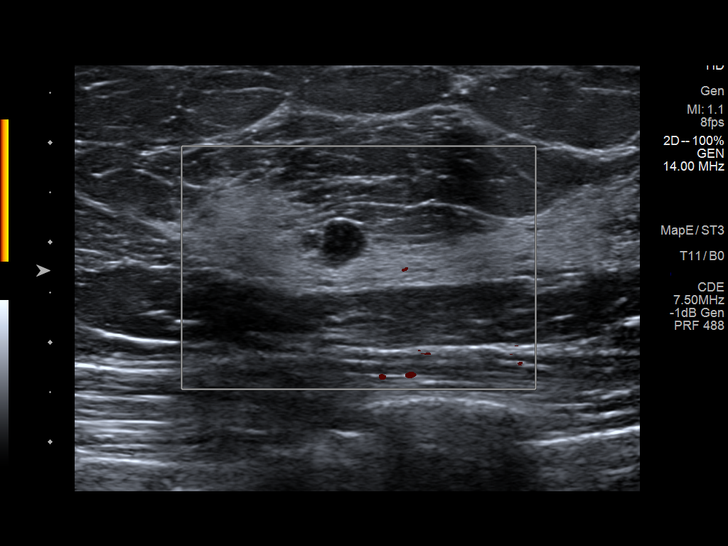
[im 7/8]
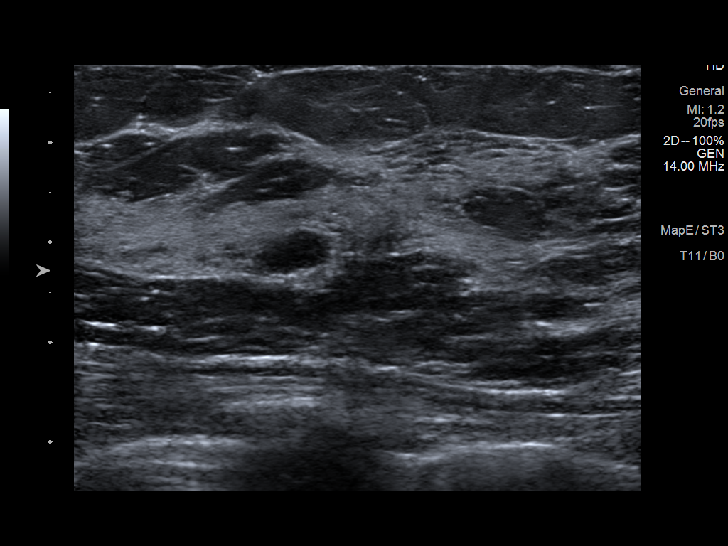
[im 8/8]
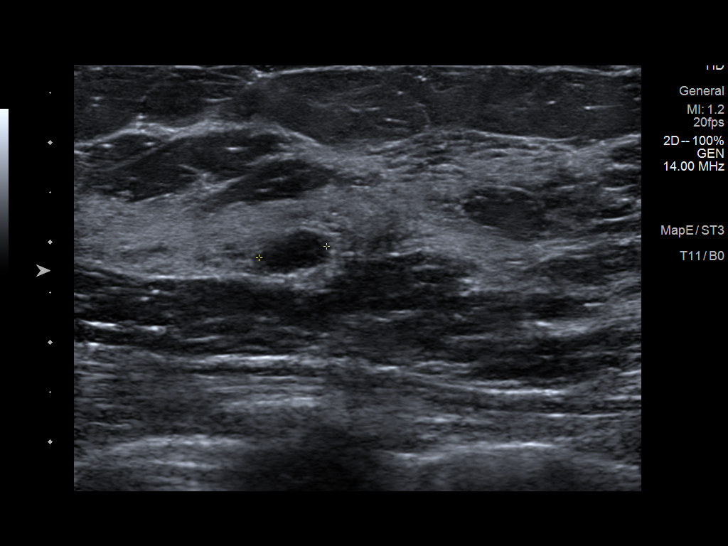

[Series 2: us breast*right* limited inc axilla · 0.07mm/px · 2 of 2 slices shown (2 of 2)]
[im 1/2]
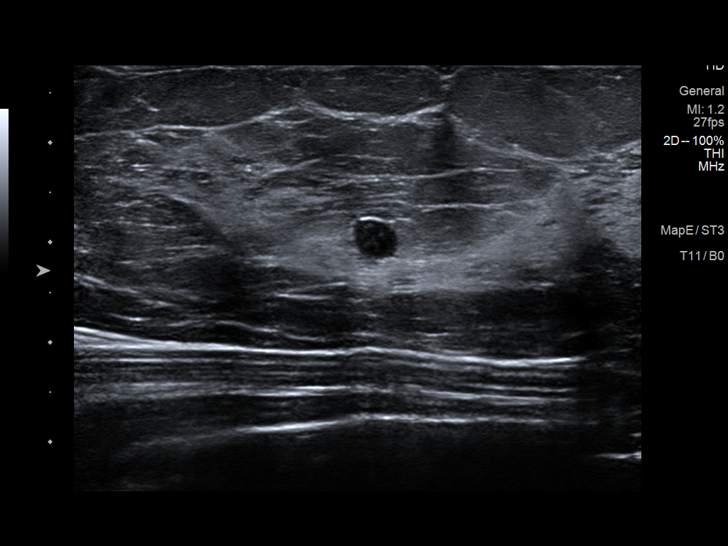
[im 2/2]
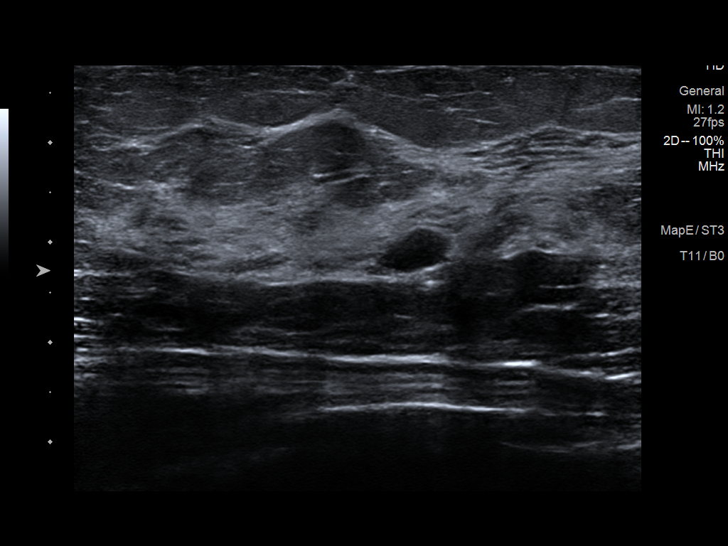

[10 of 10 positions shown; findings below may reference images not displayed]

ACR Breast Density Category c: The breast tissue is heterogeneously
dense, which may obscure small masses.
FINDINGS: Additional 2-D and 3-D images are performed. These views confirm
presence of a 5 millimeter circumscribed oval mass in the LATERAL
portion of the RIGHT breast and further evaluated with ultrasound.

Mammographic images were processed with CAD.

Targeted ultrasound is performed, showing a circumscribed anechoic
oval mass in the 10 o'clock location of the RIGHT breast 5
centimeters from the nipple which measures 0.7 x 0.4 x
centimeters. An adjacent circumscribed oval anechoic mass is 0.6 x
0.4 x 0.4 centimeters. No solid masses or areas of acoustic
shadowing.
IMPRESSION: Benign simple cysts in the area of concern in the UPPER-OUTER
QUADRANT of the RIGHT breast. No mammographic or ultrasound evidence
for malignancy.

RECOMMENDATION:
Screening mammogram in one year.(Code:[id number])

I have discussed the findings and recommendations with the patient.
If applicable, a reminder letter will be sent to the patient
regarding the next appointment.

BI-RADS CATEGORY  2: Benign.

## 2022-05-06 ENCOUNTER — Encounter: Payer: Self-pay | Admitting: Internal Medicine
